# Patient Record
Sex: Male | Born: 1970 | Race: White | Hispanic: No | Marital: Single | Smoking: Current every day smoker
Health system: Southern US, Community
[De-identification: ages and names within clinical notes are randomized; demographics above are authoritative.]

## PROBLEM LIST (undated history)

## (undated) DIAGNOSIS — E785 Hyperlipidemia, unspecified: Secondary | ICD-10-CM

## (undated) DIAGNOSIS — F329 Major depressive disorder, single episode, unspecified: Secondary | ICD-10-CM

## (undated) DIAGNOSIS — F419 Anxiety disorder, unspecified: Secondary | ICD-10-CM

## (undated) DIAGNOSIS — A630 Anogenital (venereal) warts: Secondary | ICD-10-CM

## (undated) DIAGNOSIS — Z22322 Carrier or suspected carrier of Methicillin resistant Staphylococcus aureus: Secondary | ICD-10-CM

## (undated) DIAGNOSIS — G4700 Insomnia, unspecified: Secondary | ICD-10-CM

## (undated) DIAGNOSIS — N189 Chronic kidney disease, unspecified: Secondary | ICD-10-CM

## (undated) DIAGNOSIS — M549 Dorsalgia, unspecified: Secondary | ICD-10-CM

## (undated) DIAGNOSIS — M542 Cervicalgia: Secondary | ICD-10-CM

## (undated) DIAGNOSIS — F32A Depression, unspecified: Secondary | ICD-10-CM

## (undated) DIAGNOSIS — M1990 Unspecified osteoarthritis, unspecified site: Secondary | ICD-10-CM

## (undated) DIAGNOSIS — G8929 Other chronic pain: Secondary | ICD-10-CM

## (undated) HISTORY — DX: Anogenital (venereal) warts: A63.0

## (undated) HISTORY — DX: Other chronic pain: G89.29

## (undated) HISTORY — DX: Unspecified osteoarthritis, unspecified site: M19.90

## (undated) HISTORY — DX: Anxiety disorder, unspecified: F41.9

## (undated) HISTORY — DX: Dorsalgia, unspecified: M54.9

## (undated) HISTORY — DX: Chronic kidney disease, unspecified: N18.9

## (undated) HISTORY — DX: Depression, unspecified: F32.A

## (undated) HISTORY — DX: Major depressive disorder, single episode, unspecified: F32.9

## (undated) HISTORY — DX: Insomnia, unspecified: G47.00

## (undated) HISTORY — PX: BACK SURGERY: SHX140

## (undated) HISTORY — PX: NECK SURGERY: SHX720

## (undated) HISTORY — DX: Cervicalgia: M54.2

## (undated) HISTORY — DX: Hyperlipidemia, unspecified: E78.5

## (undated) HISTORY — DX: Carrier or suspected carrier of methicillin resistant Staphylococcus aureus: Z22.322

---

## 2006-07-14 ENCOUNTER — Emergency Department: Payer: Self-pay | Admitting: Emergency Medicine

## 2006-10-10 ENCOUNTER — Ambulatory Visit (HOSPITAL_COMMUNITY): Admission: RE | Admit: 2006-10-10 | Discharge: 2006-10-11 | Payer: Self-pay | Admitting: Neurological Surgery

## 2006-10-29 ENCOUNTER — Encounter: Admission: RE | Admit: 2006-10-29 | Discharge: 2006-10-29 | Payer: Self-pay | Admitting: Neurological Surgery

## 2007-04-07 ENCOUNTER — Encounter: Admission: RE | Admit: 2007-04-07 | Discharge: 2007-04-07 | Payer: Self-pay | Admitting: Neurological Surgery

## 2007-07-21 ENCOUNTER — Encounter: Admission: RE | Admit: 2007-07-21 | Discharge: 2007-07-21 | Payer: Self-pay | Admitting: Neurological Surgery

## 2007-10-20 ENCOUNTER — Encounter: Admission: RE | Admit: 2007-10-20 | Discharge: 2007-10-20 | Payer: Self-pay | Admitting: Neurological Surgery

## 2008-02-01 ENCOUNTER — Inpatient Hospital Stay (HOSPITAL_COMMUNITY): Admission: EM | Admit: 2008-02-01 | Discharge: 2008-02-09 | Payer: Self-pay | Admitting: Emergency Medicine

## 2008-02-06 ENCOUNTER — Ambulatory Visit: Payer: Self-pay | Admitting: Physical Medicine & Rehabilitation

## 2008-03-01 ENCOUNTER — Encounter: Admission: RE | Admit: 2008-03-01 | Discharge: 2008-03-01 | Payer: Self-pay | Admitting: Neurological Surgery

## 2008-04-12 ENCOUNTER — Encounter: Admission: RE | Admit: 2008-04-12 | Discharge: 2008-04-12 | Payer: Self-pay | Admitting: Neurological Surgery

## 2008-06-24 ENCOUNTER — Ambulatory Visit (HOSPITAL_COMMUNITY): Admission: RE | Admit: 2008-06-24 | Discharge: 2008-06-24 | Payer: Self-pay | Admitting: Neurological Surgery

## 2008-08-09 ENCOUNTER — Encounter: Admission: RE | Admit: 2008-08-09 | Discharge: 2008-08-09 | Payer: Self-pay | Admitting: Neurological Surgery

## 2008-10-11 ENCOUNTER — Encounter: Admission: RE | Admit: 2008-10-11 | Discharge: 2008-10-11 | Payer: Self-pay | Admitting: Neurological Surgery

## 2009-01-25 ENCOUNTER — Encounter: Admission: RE | Admit: 2009-01-25 | Discharge: 2009-01-25 | Payer: Self-pay | Admitting: Neurological Surgery

## 2009-04-25 ENCOUNTER — Encounter: Admission: RE | Admit: 2009-04-25 | Discharge: 2009-04-25 | Payer: Self-pay | Admitting: Neurological Surgery

## 2009-07-25 ENCOUNTER — Encounter: Admission: RE | Admit: 2009-07-25 | Discharge: 2009-07-25 | Payer: Self-pay | Admitting: Neurological Surgery

## 2010-07-17 ENCOUNTER — Emergency Department: Payer: Self-pay | Admitting: Emergency Medicine

## 2010-11-29 ENCOUNTER — Ambulatory Visit: Payer: Self-pay | Admitting: Pain Medicine

## 2011-01-01 ENCOUNTER — Ambulatory Visit: Payer: Self-pay | Admitting: Pain Medicine

## 2011-03-19 ENCOUNTER — Other Ambulatory Visit: Payer: Self-pay | Admitting: Neurological Surgery

## 2011-03-19 DIAGNOSIS — M545 Low back pain: Secondary | ICD-10-CM

## 2011-04-13 ENCOUNTER — Ambulatory Visit
Admission: RE | Admit: 2011-04-13 | Discharge: 2011-04-13 | Disposition: A | Discharge status: Home / Self Care | Payer: Medicare Other | Source: Ambulatory Visit | Attending: Neurological Surgery | Admitting: Neurological Surgery

## 2011-04-13 DIAGNOSIS — M545 Low back pain: Secondary | ICD-10-CM

## 2011-04-23 ENCOUNTER — Encounter (HOSPITAL_COMMUNITY)
Admission: RE | Admit: 2011-04-23 | Discharge: 2011-04-23 | Disposition: A | Discharge status: Home / Self Care | Payer: Medicare Other | Source: Ambulatory Visit | Attending: Neurological Surgery | Admitting: Neurological Surgery

## 2011-04-23 ENCOUNTER — Other Ambulatory Visit (HOSPITAL_COMMUNITY): Payer: Self-pay | Admitting: Neurological Surgery

## 2011-04-23 ENCOUNTER — Ambulatory Visit (HOSPITAL_COMMUNITY)
Admission: RE | Admit: 2011-04-23 | Discharge: 2011-04-23 | Disposition: A | Discharge status: Home / Self Care | Payer: Medicare Other | Source: Ambulatory Visit | Attending: Neurological Surgery | Admitting: Neurological Surgery

## 2011-04-23 DIAGNOSIS — M542 Cervicalgia: Secondary | ICD-10-CM | POA: Insufficient documentation

## 2011-04-23 DIAGNOSIS — Z01812 Encounter for preprocedural laboratory examination: Secondary | ICD-10-CM | POA: Insufficient documentation

## 2011-04-23 DIAGNOSIS — Z01811 Encounter for preprocedural respiratory examination: Secondary | ICD-10-CM | POA: Insufficient documentation

## 2011-04-23 DIAGNOSIS — Z01818 Encounter for other preprocedural examination: Secondary | ICD-10-CM | POA: Insufficient documentation

## 2011-04-23 LAB — CBC
MCH: 30.9 pg (ref 26.0–34.0)
MCHC: 34.3 g/dL (ref 30.0–36.0)
Platelets: 243 10*3/uL (ref 150–400)
RDW: 12.6 % (ref 11.5–15.5)

## 2011-04-23 LAB — DIFFERENTIAL
Basophils Relative: 0 % (ref 0–1)
Eosinophils Absolute: 0.1 10*3/uL (ref 0.0–0.7)
Eosinophils Relative: 1 % (ref 0–5)
Monocytes Absolute: 1 10*3/uL (ref 0.1–1.0)
Monocytes Relative: 8 % (ref 3–12)

## 2011-04-23 LAB — BASIC METABOLIC PANEL
CO2: 29 mEq/L (ref 19–32)
Calcium: 9.8 mg/dL (ref 8.4–10.5)
Creatinine, Ser: 0.87 mg/dL (ref 0.4–1.5)
Glucose, Bld: 84 mg/dL (ref 70–99)
Sodium: 136 mEq/L (ref 135–145)

## 2011-04-23 LAB — PROTIME-INR: Prothrombin Time: 12.5 seconds (ref 11.6–15.2)

## 2011-04-25 ENCOUNTER — Inpatient Hospital Stay (HOSPITAL_COMMUNITY)
Admission: RE | Admit: 2011-04-25 | Discharge: 2011-04-26 | DRG: 460 | Disposition: A | Discharge status: Home / Self Care | Payer: Medicare Other | Source: Ambulatory Visit | Attending: Neurological Surgery | Admitting: Neurological Surgery

## 2011-04-25 DIAGNOSIS — F17200 Nicotine dependence, unspecified, uncomplicated: Secondary | ICD-10-CM | POA: Diagnosis present

## 2011-04-25 DIAGNOSIS — Y834 Other reconstructive surgery as the cause of abnormal reaction of the patient, or of later complication, without mention of misadventure at the time of the procedure: Secondary | ICD-10-CM | POA: Diagnosis present

## 2011-04-25 DIAGNOSIS — Z01812 Encounter for preprocedural laboratory examination: Secondary | ICD-10-CM

## 2011-04-25 DIAGNOSIS — Z472 Encounter for removal of internal fixation device: Secondary | ICD-10-CM

## 2011-04-25 DIAGNOSIS — T8489XA Other specified complication of internal orthopedic prosthetic devices, implants and grafts, initial encounter: Secondary | ICD-10-CM | POA: Diagnosis present

## 2011-04-25 DIAGNOSIS — T84498A Other mechanical complication of other internal orthopedic devices, implants and grafts, initial encounter: Principal | ICD-10-CM | POA: Diagnosis present

## 2011-04-25 DIAGNOSIS — M549 Dorsalgia, unspecified: Secondary | ICD-10-CM | POA: Diagnosis present

## 2011-04-25 DIAGNOSIS — Z01810 Encounter for preprocedural cardiovascular examination: Secondary | ICD-10-CM

## 2011-04-25 DIAGNOSIS — G8929 Other chronic pain: Secondary | ICD-10-CM | POA: Diagnosis present

## 2011-04-25 DIAGNOSIS — Y92009 Unspecified place in unspecified non-institutional (private) residence as the place of occurrence of the external cause: Secondary | ICD-10-CM

## 2011-04-30 NOTE — Op Note (Signed)
NAME:  Mark Snow, Mark Snow NO.:  [id number]  MEDICAL RECORD NO.:  [id number]           PATIENT TYPE:  I  LOCATION:  3526                         FACILITY:  [facility]  PHYSICIAN:  Tia Alert, MD     DATE OF BIRTH:  August 02, 1971  DATE OF PROCEDURE:  04/25/2011 DATE OF DISCHARGE:                              OPERATIVE REPORT   PREOPERATIVE DIAGNOSES: 1. Painful hardware, L2-L4. 2. 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  POSTOPERATIVE DIAGNOSES: 1. Painful hardware, L2-L4. 2. 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  PROCEDURES: 1. Lumbar re-exploration with exploration of fusion, L2-L4. 2. Removal of segmental fixation, L2-L4. 3. Intertransverse arthrodesis, L2-L4 utilizing the Osteocel Plus and     Actifuse putty.  SURGEON:  Tia Alert, MD  ASSISTANT:  Donalee Citrin, MD  ANESTHESIA:  General endotracheal.  COMPLICATIONS:  None apparent.  INDICATIONS FOR PROCEDURE:  Mark Snow is a 40 year old gentleman who 3- 1/2 years ago underwent a L2-L4 open reduction and internal fixation of an L3 fracture.  He presented with chronic back pain.  He had a CT scan, which showed lucency around the L4 screws suggestive of a pseudoarthrosis at L3-4, it look it has a more solid fusion at L2-3.  I did flexion/extension views that showed no pathologic motion in any of the levels.  I recommended a lumbar re-exploration with removal of lumbar hardware.  I felt based on a CT scan that he had enough residual facet on each side after his open reduction and internal fixation to allow stability at L3-4.  I felt that he had a healed fracture of L3 and therefore and given his lack of pathologic motion on flexion/extension films, I felt this was probably a stable situation and it was probably safe to remove the hardware without redoing a fusion from L3 extended hardware down to L5.  This was all discussed with the patient in detail, he demonstrated understanding and wished to proceed.  FINDINGS AT OPERATION:  The  fusion was explored as stated above.  After removal of the rod, I pulled on each pedicle screw.  When pulling on the L2 and L3 pedicle screws, L2-3 moved as a unit suggesting fusion of L2- L3.  There was movement between L3 and L4 and the screws were obviously loose in L4.  The decision was made to expose the transverse processes on each side and redo the fusion from L2-L4 in hopes of achieving even further stability of the segments; however, it was not felt that this was an overly unstable situation and therefore I did not feel that I needed to re-instrument him down to L5 to add stability to the segments, this was a clinical judgment made intraoperatively.  DESCRIPTION OF PROCEDURE:  The patient was taken to the operating room. After induction of adequate generalized endotracheal anesthesia, he was rolled in prone position on chest rolls and all pressure points were padded.  His lumbar region was prepped with DuraPrep and draped in usual sterile fashion.  10 mL of local anesthesia was injected.  His old incision was opened and I carried this down through the fascia, identified the spinous processes, and then took down the musculature  in a subperiosteal fashion.  I carried this out laterally to identify the pedicle screws at L2, L3, and L4.  The tubal heads were identified and the tissues were resected from these and exposed the rods.  I then was able to remove the locking caps and then removed to rods in the cross- link.  I then pulled on each pedicle screw, the L2 and L3 segment seemed to be fused as all four screws moved in unison; however, there was movement at L3-4 between L3 and the L4 pedicle screws, suggesting a nonunion of the segment.  It did not appear to be overly unstable by this, I went back and reviewed his films.  I reviewed his CT scan and his flexion/extension views once again.  I still felt clinically based on what we are finding in the operation and based on his  films, we did need to re-instrument him, but it did make me want to expose his transverse processes and lie a bony mixture in the intertransverse space to try to achieve a redo intertransverse arthrodesis.  I was able to remove each pedicle screw, I then took my dissection out laterally, exposed the transverse processes.  I then decorticated each of these with a high-speed drill and placed a mixture of Osteocel Plus and Actifuse putty into the intertransverse space to perform arthrodesis at L2-L4 bilaterally.  I then placed a medium Hemovac drain and then dried all bleeding points and then closed the muscle and fascia with 0-Vicryl closing the subcutaneous and subcuticular tissues with 2-0 and 3-0 Vicryl and closed the skin with Benzoin and Steri-Strips.  The drapes were removed.  Sterile dressing was applied.  The patient was awakened from anesthesia and transferred to the recovery room in a stable condition.  At the end of the procedure, all sponge, needle, and instrument sponge counts were correct.     Tia Alert, MD     DSJ/MEDQ  D:  04/25/2011  T:  04/26/2011  Job:  [id number]  Electronically Signed by Marikay Alar MD on 04/30/2011 08:33:45 AM

## 2011-05-08 NOTE — Op Note (Signed)
NAME:  Mark Snow, Mark Snow NO.:  [id number]   MEDICAL RECORD NO.:  [id number]          PATIENT TYPE:  INP   LOCATION:  1831                         FACILITY:  [facility]   PHYSICIAN:  Tia Alert, MD     DATE OF BIRTH:  August 14, 1971   DATE OF PROCEDURE:  02/01/2008  DATE OF DISCHARGE:                               OPERATIVE REPORT   PREOPERATIVE DIAGNOSES:  Three column, L3 fracture with severe canal  compromise and numbness in the right leg.   POSTOPERATIVE DIAGNOSES:  Three column, L3 fracture with severe canal  compromise and numbness in the right leg with traumatic dural tear L3-4  and on the L3 nerve root.   PROCEDURE:  1. Open reduction internal fixation of L3 fracture.  2. Decompressive laminectomy L2-3, L3-4 for central canal and lateral      recess decompression with reduction of retropulsed fragments.  3. Repair of traumatic dural laceration, L3-4 with a running 6-0      Prolene suture and repair of a small dural tear over the L3 nerve      root also traumatic and not unintended.  4. Intertransverse arthrodesis, L2-L4 inclusive utilizing locally      harvested morselized autologous bone graft mixed with Actifuse      putty.  5. Segmental fixation L2-L4, utilizing the Legacy pedicle screw      system.   SURGEON:  Tia Alert, MD.   ASSISTANT:  None.   ANESTHESIA:  General endotracheal.   COMPLICATIONS:  None apparent.   INDICATIONS FOR PROCEDURE:  Mark Snow is a 40 year old gentleman who  is known to me who is status post a two-level ACDF with plating who fell  off a horse earlier this evening, suffering an L3 fracture.  This was a  three column fracture and it was felt to be unstable.  There was  significant retropulsion of the fragments of L3 causing severe canal  stenosis, especially at the pedicle level of L3.  He had numbness in his  right leg.  I recommended urgent decompression and instrumented fusion.  He understood the risks, benefits,  and suspected outcome and wished to  proceed.  I discussed with the family the possibility of a dural tear  secondary to the canal stenosis and the fracture fragments and indeed  this was what we found at the time of surgery was a long dural tear  extending from the pedicle level of L3 to the inferior disk level of L3-  4 and also a small dural tear over the nerve root itself of L3 and the  foramen, but also leaking spinal fluid.  Several nerve roots had  herniated through the long dural tear into the epidural space.  The  patient's family did understand the risks, benefits and suspected  outcome of surgery and they wished to proceed.   DESCRIPTION OF PROCEDURE:  The patient was taken to operating room and  after induction of adequate generalized endotracheal anesthesia he was  rolled in a prone position on chest rolls and all pressure points were  padded.  His lumbar region was prepped  with a DuraPrep after being  washed with Betadine and draped in the usual sterile fashion.  The 10 mL  of local anesthesia was injected and a dorsal midline incision was made  and carried down to the fascia.  The fascia was opened and the  paraspinous musculature was taken down in a subperiosteal fashion to  expose L2-3, L3-4 and L4-5.  Intraoperative fluoroscopy confirmed my  level and then I took the dissection out over the facets to expose the  transverse processes of L2, L3 and L4.  I planned on doing segmental  fixation with the pedicle screws in the pedicles of the fracture  fragment instead of taking this two levels above and two levels below  mostly because he is young and healthy with good bone quality and I have  had significant success with this in the past in my own practice and  during training.  Therefore this was the plan as long I could get good  screws into the L3 pedicle.  If I could not then I was going to take it  up to L1 and down to L5.  I did my exposure with this plan in mind.  I   then took the Leksell rongeur and removed the spinous process of L3 and  the bottom of L2.  I performed a complete laminectomy at L3 and the  bottom of L2.  I did come across significant nerve roots in the epidural  space.  Therefore, I packed this with a cottonoid and worked laterally  superiorly and inferiorly along all the normal tissue that I could find  decompressing widely in order to come in on this as best I could from  normal tissue to where the dural laceration was.  I found a very long  dural laceration that was traumatic in nature and not created during  surgery as an unintended durotomy.  I also found a small one over the L3  nerve root.  These were repaired with running 6-0 Nurolon sutures with  no leaks of CSF afterwards.  I then completed my decompression following  the L3 and the L4 nerve roots out into their foramina to decompress  them.  I achieved a wide foraminotomy at L3-4.  I used a Sypert bone  tamp to reach up under the L3 nerve root both superiorly and inferiorly  in the shoulder and the axilla the nerve root knock down fracture  fragments from the retropulsed bone.  I then palpated the coronary  dilator and felt like I had a nice decompression.  I then turned my  attention to the segmental fixation.  I localized the pedicle screw  entry zones using surface landmarks and lateral fluoroscopy.  I probed  each pedicle with a pedicle probe, tapped each pedicle with a 5/5 tap  and placed six 5 x 45 mm pedicle screws at the L2, L3 and L4 pedicles  bilaterally.  I then checked these with AP and lateral fluoroscopy.  I  felt like I had good purchase at the L3 and L4 pedicles.  I could see  the medial and superior parts of the pedicles.  I palpated and  visualized to make sure I did not have too medial trajectory with my  pedicle screws.  I then decorticated the transverse processes of L2, L3  and L4 and placed a mixture of local autograft and Actifuse out over  these to  perform intertransverse arthrodesis of L2-L4.  I  then placed  lordotic  rods into the multiaxial screw heads and locked these into  position with locking caps and anti-torque device and placed a separate  cross-link.  I irrigated with saline solution containing bacitracin,  spent considerable time drying up with bipolar cautery and with  Surgifoam and with Gelfoam and then lined the dura with Gelfoam and  Tisseel fibrin glue and then placed a medium Hemovac drain through a  separate stab incision and then closed the muscle and fascia with zero  Vicryl, closed the subcutaneous and subcuticular tissue with 2-0 and 3-0  Vicryl, and closed the skin with Benzoin and Steri-Strips.  The drapes  were removed.  A sterile dressing was applied.  The patient was awakened  from general anesthesia and transported to the recovery room in stable  condition.  At the end of procedure all sponge, needle and instrument  counts were correct.      Tia Alert, MD  Electronically Signed     DSJ/MEDQ  D:  02/02/2008  T:  02/02/2008  Job:  [id number]

## 2011-05-08 NOTE — Discharge Summary (Signed)
NAMEMarland Kitchen  Mark Snow, Mark Snow NO.:  [id number]   MEDICAL RECORD NO.:  [id number]          PATIENT TYPE:  INP   LOCATION:  3041                         FACILITY:  [facility]   PHYSICIAN:  Cherylynn Ridges, M.D.    DATE OF BIRTH:  1971/02/09   DATE OF ADMISSION:  02/01/2008  DATE OF DISCHARGE:  02/09/2008                               DISCHARGE SUMMARY   DISCHARGE DIAGNOSES:  1. Fall from horse.  2. L3 burst fracture.  3. Alcohol use.  4. Tobacco use.   CONSULTANTS:  Tia Alert, MD, for neurosurgery.   PROCEDURES:  L2 through L4 fusion.   HISTORY OF PRESENT ILLNESS:  This is a 40 year old white male who was  sitting on a horse having his picture taken when it bucked, throwing him  to the ground.  He had immediate pain and came in as a non-trauma code.  X-rays demonstrated an L3 burst fracture of significant severity.  Dr.  Yetta Barre was consulted and felt it needed stabilization.  He was taken to  the operating room immediately for a fusion and then transferred to the  floor for further care.   HOSPITAL COURSE:  The patient's hospital course was complicated mainly  by pain.  It took some time to get him under control but once we did, he  was able to mobilize with physical therapy until he got to the point  where he could go home with his mother in good condition.   DISCHARGE MEDICATIONS:  1. OxyContin 20 mg tablets take one p.o. b.i.d., #30 with no refill.  2. Percocet 10/325 mg take one to two p.o. q.4 h. p.r.n. pain, #60      with no refill.  3. Robaxin 500 mg tablets take one to two p.o. q.6 h. p.r.n. spasm,      #90 with no refill.  4. Valium 5 mg tablets take one p.o. q.6h. p.r.n. spasm, #60 with no      refill.  5. Lyrica 75 mg tablets take one p.o. b.i.d., #60 with no refill.   FOLLOW-UP:  The patient will follow up Dr. Yetta Barre as directed and will  call for ab appointment.  Follow-up with the trauma service will be on  an as-needed basis.      Earney Hamburg, P.A.      Cherylynn Ridges, M.D.  Electronically Signed    MJ/MEDQ  D:  02/09/2008  T:  02/10/2008  Job:  [id number]   cc:   Tia Alert, MD

## 2011-05-08 NOTE — Op Note (Signed)
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   PROCEDURE:  Posterior cervical exploration with posterior cervical  fusion C5-C6 utilizing Actifuse putty with nonsegmental instrumentation,  C5-C6 utilizing the vertex lateral mass fixation system.   SURGEON:  Tia Alert, MD   ANESTHESIA:  General endotracheal.   COMPLICATIONS:  None apparent.   INDICATIONS FOR PROCEDURE:  Mark Snow is a 40 year old gentleman who  had undergone a previous 2-level ACDF with plating a couple of years  ago.  He had continued neck pain.  He had a CT scan which showed  pseudoarthrosis of C5-C6.  This was felt to be a symptomatic  pseudoarthrosis.  We recommended a posterior cervical stabilization and  fusion.  He understood the risks, benefits, expected outcome, and wished  to proceed.   DESCRIPTION OF PROCEDURE:  The patient was taken to operating room.  After induction of adequate generalized endotracheal anesthesia, he was  rolled into the prone position on chest rolls and all pressure points  were padded.  His head was fixed in a 3-point [place] head rest.  His  posterior cervical region was prepped with DuraPrep and then draped in  usual sterile fashion.  Local anesthesia 5 mL was injected and a dorsal  midline incision was made and carried down to this cervical fascia.  It  was opened and the  paraspinous musculature was taken down in a  subperiosteal fashion to expose C5-C6.  Intraoperative fluoroscopy  confirmed my level and then I took the dissection out over the lateral  masses of C5-C6.  I then located the midpoints of the lateral masses and  also drilled the facet joints in order to decorticate these.  I located  the midpoints of lateral masses and decorticated 1 mm medial to the  midpoint of each lateral mass.  I then drilled in the upward and outward  direction utilizing the hand drill and the drill guide using a 14 mm  drill.  I drilled into the safe zone in an upward and outward direction.  I then used 14 mm lateral mass screws and placed these in an upward and  outward direction at C5-C6 bilaterally.  I then decorticated the lamina  and placed Actifuse putty over these bilaterally to perform a posterior  arthrodesis and then placed rods into the multiaxial screw heads of the  vertex screws and locked these into position with locking caps and an  anti-torque device.  I then irrigated with saline solution containing  bacitracin, dried all bleeding points, and then  closed the fascia with 0  Vicryl, closed the subcutaneous tissue and subcuticular tissue with 2-0  and 3-0 Vicryl,  closed the skin with Benzoin and Steri-Strips.  The drapes were removed.  Sterile dressing was applied.  The patient was awakened from general  anesthesia and transferred to recovery room in stable condition.  At the  end of procedure, all sponge, needle, and instrument counts were  correct.      Tia Alert, MD  Electronically Signed     DSJ/MEDQ  D:  06/24/2008  T:  06/24/2008  Job:  [id number]

## 2011-05-08 NOTE — Consult Note (Signed)
NAME:  Mark Snow, Mark Snow NO.:  [id number]   MEDICAL RECORD NO.:  [id number]          PATIENT TYPE:  INP   LOCATION:  1831                         FACILITY:  [facility]   PHYSICIAN:  Tia Alert, MD     DATE OF BIRTH:  08-24-1971   DATE OF CONSULTATION:  02/01/2008  DATE OF DISCHARGE:                                 CONSULTATION   CHIEF COMPLAINT:  L3 fracture.   HISTORY OF PRESENT ILLNESS:  Mark Snow is a 40 year old white male  well-known to me status post ACDF with plating at two levels who was  thrown from a horse earlier this evening.  He noted the onset of  immediate low back pain, states his legs drew up.  He did not try to  ambulate. He was taken by EMS to [facility] emergency department where  imaging showed a L3 compression fracture with significant canal  stenosis.  This is a 3-column injury. Neurosurgical evaluation was  requested.  The patient complains of numbness in the entire right leg  from the upper thigh down to the foot.  He denies any significant  weakness, though he feels like he cannot lift his right leg off the bed,  though he can his left leg.  It is difficult to know if this is pain  related or true weakness.  He does not have a catheter in and denies any  perineal numbness, though he has not urinated since the accident. He  admits to drinking 4-5 beers,  He complains of very severe back pain  which appears to be unrelenting.   MEDICAL HISTORY:  Two-level ACDF with plating   MEDICATIONS:  Occasional Tylenol or BC.   ALLERGIES:  No known drug allergies.   SOCIAL HISTORY:  Uses tobacco and alcohol products.   PHYSICAL EXAMINATION:  VITAL SIGNS:  Blood pressure 113/62, pulse 98,  respirations 20.  GENERAL:  A young white male lying in a stretcher in severe pain,  appears to be in agony.  HEENT:  Normocephalic, atraumatic.  Extraocular movements are intact.  NECK:  Nontender.  HEART:  Regular rate and rhythm.  EXTREMITIES:  No obvious  deformities.  NEUROLOGIC:  He is awake and alert.  He is interactive.  No aphasia.  His attention span is somewhat blunted by his pain. Fund of knowledge  and memory appear to be appropriate.  No facial asymmetry.  Tongue  protrudes in midline.  He can shrug his shoulders. His strength is 5/5  with good muscle tone and good muscle bulk and normal sensation in the  upper extremities. The lower extremities show good strength in the  distal lower extremities with 5/5 dorsi and plantar flexion. If I lift  his legs off the bed for him, he can keep them straight, and I cannot  break him at the knees suggestive of good quadriceps strength. I cannot  really test his hip flexor strength accurately to this in-bed exam  mostly because of pain. Sensation is grossly decreased in the right  lower extremity. Gait is not tested.   IMAGING STUDIES:  CT scan of cervical  spine shows a two-level fusion  without significant fracture or other complication.   Head CT shows a small hyperdensity in the right frontal white matter,  possibly consistent with contusion. However, this is quite small and no  edema mass effect or shift.  I see no other intracranial abnormality.   Lumbar spine CT scan shows a significant compression fracture of L3 with  greater than 50% loss of height extending into the posterior elements.  This is a three-column injury, also fracture of the right transverse  process at L3-L4.  There was retropulsion of fragments causing severe  spinal stenosis at this level with no kyphosis at this time.   ASSESSMENT/PLAN:  This is a 40 year old gentleman with a severe L3  fracture.  This is a three-column fracture. It is unstable by  definition, and he has severe spinal stenosis secondary to retropulsed  fragments.  I have recommended urgent surgical decompression and  instrumented fusion.  If we can get good screws into L3, we will make  this a short segment construct from L2-L4. However, if the  screws do not  appear to be very solid in 3, we will go L1-L5,  two levels above and  two levels below. I have explained the surgery to him and his family in  detail as best they can understand. They understand the risk of surgery  including bleeding, infection, nerve injury, numbness, weakness, CSF  leak, hardware failure, pseudoarthrosis, possible need for further  surgery, possible need for blood transfusion, lack of relief of  symptoms, worsening of symptoms, DVT, death, and anesthesia risk, and  they agree to proceed.  Questions have been encouraged and answered.  We  will proceed with surgery on an urgent basis.      Tia Alert, MD  Electronically Signed     DSJ/MEDQ  D:  02/01/2008  T:  02/02/2008  Job:  [id number]

## 2011-05-11 NOTE — Op Note (Signed)
NAME:  Mark Snow, Mark Snow NO.:  [id number]   MEDICAL RECORD NO.:  [id number]          PATIENT TYPE:  AMB   LOCATION:  SDS                          FACILITY:  [facility]   PHYSICIAN:  Tia Alert, MD     DATE OF BIRTH:  April 01, 1971   DATE OF PROCEDURE:  10/10/2006  DATE OF DISCHARGE:                                 OPERATIVE REPORT   PREOPERATIVE DIAGNOSIS:  Cervical spondylosis with cervical stenosis, C5-6,  C6-7.   POSTOPERATIVE DIAGNOSIS:  Cervical spondylosis with cervical stenosis, C5-6,  C6-7.   PROCEDURES:  1. Decompressive anterior cervical diskectomy, C5-6, C6-7.  2. Anterior cervical arthrodesis, C5-6, C6-7 utilizing a 7-mm MTF      corticocancellous allograft at C5-6 and an 8 mm graft at C6-7.  3. Anterior cervical plating, C5 to C7 inclusive, utilizing a Venture      plate.   SURGEON:  Tia Alert, MD   ASSISTANT:  Donalee Citrin, MD   ANESTHESIA:  General endotracheal.   COMPLICATIONS:  None apparent.   INDICATIONS FOR PROCEDURE:  Mark Snow is a 40 year old white male who was  referred with neck pain with occasional electric shock-like sensations down  his arms into his fingers and down his back.  He had an MRI which showed  cervical spondylosis with stenosis at C5-6 and C6-7, and there was signal  change in the spinal cord at the C6-7 level.  I recommended a two-level  decompressive anterior cervical diskectomy with fusion and plating at C5-6  and C6-7.  He understood the risks, benefits and expected outcome and he  wished to proceed.   DESCRIPTION OF PROCEDURE:  The patient was taken to the operating room and  after the induction of adequate generalized endotracheal anesthesia, he was  placed in the supine position on the operating room table.  His right  anterior cervical region was prepped with DuraPrep and then draped in the  usual sterile fashion.  Local anesthesia 5 mL was injected and a transverse  incision was made to the right of  midline and carried down to the platysma,  which was elevated, opened and undermined with Metzenbaum scissors.  I then  dissected in a plane medial to the sternocleidomastoid muscle and internal  carotid artery and lateral to the trachea and esophagus to expose C5-6 and  C6-7.  Intraoperative fluoroscopy confirmed my level and then the longus  colli muscles were taken down bilaterally and the Shadow Line retractors  were placed under these to expose C5-6 and C6-7.  The annulus was incised  and the initial diskectomy was done with pituitary rongeurs and curved  curettes.  We then brought in the high-speed drill and drilled each disk  space in a rectangular fashion, widening the disk space to 7 mm at C5-6 and  8 mm of C6-7, where he had signal change in the spinal cord.  We drilled the  endplates to prepare for later arthrodesis.  We drilled down to the level of  the posterior longitudinal ligament, at which time we brought in the  operating microscope.  We opened the posterior  longitudinal ligament at C6-7  with a nerve hook and then removed it in a circumferential fashion by  undercutting the bodies of C6 and C7 with the 2 and 1 mm Kerrison punch.  Bilateral foraminotomies were performed.  We could palpate the pedicles,  palpate along the superior surface of the pedicles to assure adequate  decompression of the nerve roots.  The central canal was well-decompressed.  The dura was capacious and full all the way across.  Again, we had undercut  the body of C6 and C7 with a 2-mm Kerrison punch, utilizing a microscope to  look as far up and as far down as we could to assure adequate decompression.  We used a nerve hook and palpated to assure adequate decompression.  We then  went to C5-6 and performed the exact same decompression.  I opened the  posterior longitudinal ligament and performed a circumferential  decompression with the 2-mm Kerrison punch, undercutting the body of C5 and  C6 to  decompress the central canal.  Bilateral foraminotomies were  performed.  We palpated with a nerve hook to assure adequate decompression  of the nerve roots and the central canal.  Once our decompression complete,  we irrigated with saline solution containing bacitracin, dried our surgical  bed with Gelfoam, measured our interspaces and used a 7 mm MTF  corticocancellous allograft at C5-6 and an 8 mm graft at C6-7 and tapped  these into position, countersinking these by a millimeter.  We then used a  Venture plate, placed two 13 mm variable-angle screws into the bodies of C5,  C6 and C7, and these were locked into the plate by locking mechanism on the  plate.  We then spent considerable time drying up with bipolar cautery with  Gelfoam and finally with Surgifoam and with significant amounts of  irrigation and once meticulous hemostasis was achieved, closed the platysma  with 3-0 Vicryl, closed the subcuticular tissue with 3-0 Vicryl, and closed  the skin with Benzoin and Steri-Strips.  The drapes were removed.  A sterile  dressing was applied.  The patient was awakened from general anesthesia and  transported to the recovery room in stable condition.  At the end of the  procedure all sponge, needle and instrument counts were correct.      Tia Alert, MD  Electronically Signed     DSJ/MEDQ  D:  10/10/2006  T:  10/11/2006  Job:  [id number]

## 2011-05-22 ENCOUNTER — Other Ambulatory Visit: Payer: Self-pay | Admitting: Neurological Surgery

## 2011-05-22 ENCOUNTER — Ambulatory Visit
Admission: RE | Admit: 2011-05-22 | Discharge: 2011-05-22 | Disposition: A | Discharge status: Home / Self Care | Payer: Medicare Other | Source: Ambulatory Visit | Attending: Neurological Surgery | Admitting: Neurological Surgery

## 2011-05-22 DIAGNOSIS — M79609 Pain in unspecified limb: Secondary | ICD-10-CM

## 2011-05-22 DIAGNOSIS — M545 Low back pain: Secondary | ICD-10-CM

## 2011-05-22 DIAGNOSIS — Z981 Arthrodesis status: Secondary | ICD-10-CM

## 2011-09-14 LAB — CROSSMATCH: ABO/RH(D): O POS

## 2011-09-14 LAB — DIFFERENTIAL
Basophils Absolute: 0
Eosinophils Relative: 1
Lymphocytes Relative: 27
Lymphs Abs: 3.1
Monocytes Absolute: 0.7

## 2011-09-14 LAB — BASIC METABOLIC PANEL
CO2: 22
Calcium: 7.9 — ABNORMAL LOW
Creatinine, Ser: 0.89
Glucose, Bld: 135 — ABNORMAL HIGH

## 2011-09-14 LAB — ETHANOL: Alcohol, Ethyl (B): 151 — ABNORMAL HIGH

## 2011-09-14 LAB — I-STAT 8, (EC8 V) (CONVERTED LAB)
Acid-base deficit: 4 — ABNORMAL HIGH
Chloride: 103
HCT: 40
Hemoglobin: 13.6
Operator id: 281221
Potassium: 3.7

## 2011-09-14 LAB — POCT I-STAT CREATININE: Creatinine, Ser: 1.2

## 2011-09-14 LAB — CBC
HCT: 37 — ABNORMAL LOW
Hemoglobin: 10.9 — ABNORMAL LOW
Hemoglobin: 13
MCHC: 35.2
MCV: 94
RDW: 12.3
RDW: 12.5

## 2011-09-20 LAB — BASIC METABOLIC PANEL
Calcium: 9.3
Creatinine, Ser: 0.99
GFR calc Af Amer: >60
GFR calc non Af Amer: >60
Glucose, Bld: 102 — ABNORMAL HIGH
Sodium: 138

## 2011-09-20 LAB — DIFFERENTIAL
Basophils Relative: 0
Eosinophils Absolute: 0.2
Eosinophils Relative: 3
Lymphs Abs: 2
Monocytes Relative: 10

## 2011-09-20 LAB — CBC
HCT: 42.3
MCHC: 34.8
MCV: 92.6
Platelets: 197
WBC: 6.5

## 2011-09-24 ENCOUNTER — Emergency Department: Payer: Self-pay | Admitting: Emergency Medicine

## 2013-12-15 ENCOUNTER — Emergency Department: Payer: Self-pay | Admitting: Internal Medicine

## 2014-03-22 ENCOUNTER — Ambulatory Visit: Payer: Self-pay | Admitting: General Surgery

## 2014-03-22 ENCOUNTER — Encounter: Payer: Self-pay | Admitting: General Surgery

## 2014-03-22 ENCOUNTER — Other Ambulatory Visit: Payer: Self-pay | Admitting: General Surgery

## 2014-03-22 ENCOUNTER — Ambulatory Visit (INDEPENDENT_AMBULATORY_CARE_PROVIDER_SITE_OTHER): Payer: Medicare Other | Admitting: General Surgery

## 2014-03-22 VITALS — BP 122/80 | HR 84 | Temp 98.0°F | Resp 14 | Ht 67.0 in | Wt 183.0 lb

## 2014-03-22 DIAGNOSIS — L03319 Cellulitis of trunk, unspecified: Secondary | ICD-10-CM

## 2014-03-22 DIAGNOSIS — L03317 Cellulitis of buttock: Secondary | ICD-10-CM

## 2014-03-22 DIAGNOSIS — L0231 Cutaneous abscess of buttock: Secondary | ICD-10-CM

## 2014-03-22 DIAGNOSIS — L02219 Cutaneous abscess of trunk, unspecified: Secondary | ICD-10-CM | POA: Diagnosis not present

## 2014-03-22 HISTORY — PX: INCISION AND DRAINAGE ABSCESS: SHX5864

## 2014-03-22 LAB — CBC WITH DIFFERENTIAL/PLATELET
BASOS PCT: 0.4 %
Basophil #: 0 10*3/uL (ref 0.0–0.1)
EOS ABS: 0.2 10*3/uL (ref 0.0–0.7)
EOS PCT: 3 %
HCT: 36 % — ABNORMAL LOW (ref 40.0–52.0)
HGB: 12.5 g/dL — ABNORMAL LOW (ref 13.0–18.0)
LYMPHS ABS: 1.8 10*3/uL (ref 1.0–3.6)
LYMPHS PCT: 24 %
MCH: 31.5 pg (ref 26.0–34.0)
MCHC: 34.5 g/dL (ref 32.0–36.0)
MCV: 91 fL (ref 80–100)
Monocyte #: 0.8 x10 3/mm (ref 0.2–1.0)
Monocyte %: 10.3 %
Neutrophil #: 4.8 10*3/uL (ref 1.4–6.5)
Neutrophil %: 62.3 %
PLATELETS: 239 10*3/uL (ref 150–440)
RBC: 3.96 10*6/uL — AB (ref 4.40–5.90)
RDW: 12.6 % (ref 11.5–14.5)
WBC: 7.6 10*3/uL (ref 3.8–10.6)

## 2014-03-22 LAB — BASIC METABOLIC PANEL
Anion Gap: 6 — ABNORMAL LOW (ref 7–16)
BUN: 11 mg/dL (ref 7–18)
CHLORIDE: 103 mmol/L (ref 98–107)
CO2: 25 mmol/L (ref 21–32)
Calcium, Total: 8.8 mg/dL (ref 8.5–10.1)
Creatinine: 1.01 mg/dL (ref 0.60–1.30)
EGFR (African American): >60
GLUCOSE: 84 mg/dL (ref 65–99)
OSMOLALITY: 267 (ref 275–301)
POTASSIUM: 3.9 mmol/L (ref 3.5–5.1)
SODIUM: 134 mmol/L — AB (ref 136–145)

## 2014-03-22 NOTE — Progress Notes (Signed)
Patient ID: Mark LeffKenneth S Snow, male   DOB: 1971-10-28, 43 y.o.   MRN: [id number]  Chief Complaint  Patient presents with  . Other    skin lesion    HPI [name] is a 43 y.o. male here today for a evaluation of a skin lesion. Patient states the area on his right buttock and left inguinal area . The area is red and swollen very painful he states. He states he thinks he  got bit by a insect about a week ago.  The right hip area is similar to a spider bite he experienced 10 years ago. The left perineal/groin abscess has been different with increased fluctuance and persistent drainage. The patient was placed on Septra on 03/19/2014 by his primary care physician.  He is not any difficulty with bowel or bladder function. No history of perianal pain.   HPI  Past Medical History  Diagnosis Date  . Back pain   . HLD (hyperlipidemia)     Past Surgical History  Procedure Laterality Date  . Back surgery  [id number]    Dr. Yetta BarreJones  . Neck surgery  2007,2010    Family History  Problem Relation Age of Onset  . Lung cancer Father     Social History History  Substance Use Topics  . Smoking status: Current Every Day Smoker -- 1.00 packs/day for 20 years    Types: Cigarettes  . Smokeless tobacco: Never Used  . Alcohol Use: No    No Known Allergies  Current Outpatient Prescriptions  Medication Sig Dispense Refill  . aspirin 81 MG tablet Take 81 mg by mouth daily.      Marland Kitchen. atorvastatin (LIPITOR) 20 MG tablet Take 20 mg by mouth daily.      . clonazePAM (KLONOPIN) 1 MG tablet Take 1 mg by mouth at bedtime.      . cyclobenzaprine (FLEXERIL) 10 MG tablet Take 10 mg by mouth 3 (three) times daily as needed for muscle spasms.      . OxyCODONE HCl ER 30 MG T12A Take 1 tablet by mouth 4 (four) times daily as needed.       No current facility-administered medications for this visit.    Review of Systems Review of Systems  Constitutional: Negative.   Respiratory: Negative.    Cardiovascular: Negative.     Blood pressure 122/80, pulse 84, temperature 98 F (36.7 C), resp. rate 14, height 5\' 7"  (1.702 m), weight 183 lb (83.008 kg).  Physical Exam Physical Exam  Constitutional: He is oriented to person, place, and time. He appears well-developed and well-nourished.  Eyes: Conjunctivae are normal.  Neck: Neck supple.  Cardiovascular: Normal rate, regular rhythm and normal heart sounds.   Pulmonary/Chest: Effort normal and breath sounds normal.  Genitourinary: Testes normal.     Neurological: He is alert and oriented to person, place, and time.  Skin: Skin is warm and dry.        Assessment    Left perineal/groin abscess. Right lateral hip abscess.    Plan    The patient is having far too much local tenderness or any procedure under local anesthesia. General anesthesia is required for adequate drainage of the left perineal/groin abscess.  The patient reported having had a "sip" of Sprite between 11- 12 this morning. The case was reviewed by phone with [name], M.D. Plans are for general anesthesia to be administered at 3 PM today.       Earline MayotteByrnett, [name] 03/22/2014, 12:35 PM

## 2014-03-23 ENCOUNTER — Encounter: Payer: Self-pay | Admitting: General Surgery

## 2014-03-24 ENCOUNTER — Telehealth: Payer: Self-pay | Admitting: *Deleted

## 2014-03-24 LAB — PATHOLOGY REPORT

## 2014-03-24 NOTE — Telephone Encounter (Signed)
[facility] lab called to let us know patient had a culture from groin and right hip, results MRSA, Dr. Lemar LivingsByrnett aware and patient is on appropriate antibiotic therapy.

## 2014-03-25 ENCOUNTER — Encounter: Payer: Self-pay | Admitting: General Surgery

## 2014-03-25 ENCOUNTER — Ambulatory Visit (INDEPENDENT_AMBULATORY_CARE_PROVIDER_SITE_OTHER): Payer: Medicare Other | Admitting: General Surgery

## 2014-03-25 VITALS — BP 114/78 | HR 84 | Resp 16 | Ht 67.0 in | Wt 184.0 lb

## 2014-03-25 DIAGNOSIS — L0231 Cutaneous abscess of buttock: Secondary | ICD-10-CM

## 2014-03-25 DIAGNOSIS — L03317 Cellulitis of buttock: Principal | ICD-10-CM

## 2014-03-25 MED ORDER — SULFAMETHOXAZOLE-TMP DS 800-160 MG PO TABS: 1.0000 | ORAL_TABLET | Freq: Two times a day (BID) | ORAL | Status: DC

## 2014-03-25 NOTE — Patient Instructions (Addendum)
Patient to return in one week. 

## 2014-03-25 NOTE — Progress Notes (Signed)
Patient ID: Mark Snow, male   DOB: [birth year], 43 y.o.   MRN: [id number]  Chief Complaint  Patient presents with  . Routine Post Op    I & D on buttock     HPI Mark LeffKenneth S Snow is a 43 y.o. male. here today for his post op  I & D on buttock and left groin abscess done on 03/22/14. Patient states he is still very sore, but significantly improved from before surgery.   HPI  Past Medical History  Diagnosis Date  . Back pain   . HLD (hyperlipidemia)     Past Surgical History  Procedure Laterality Date  . Back surgery  [id number]    Dr. Yetta BarreJones  . Neck surgery  2007,2010  . Incision and drainage abscess  03/22/14    buttock    Family History  Problem Relation Age of Onset  . Lung cancer Father     Social History History  Substance Use Topics  . Smoking status: Current Every Day Smoker -- 1.00 packs/day for 20 years    Types: Cigarettes  . Smokeless tobacco: Never Used  . Alcohol Use: No    No Known Allergies  Current Outpatient Prescriptions  Medication Sig Dispense Refill  . aspirin 81 MG tablet Take 81 mg by mouth daily.      Marland Kitchen. atorvastatin (LIPITOR) 20 MG tablet Take 20 mg by mouth daily.      . clonazePAM (KLONOPIN) 1 MG tablet Take 1 mg by mouth at bedtime.      . cyclobenzaprine (FLEXERIL) 10 MG tablet Take 10 mg by mouth 3 (three) times daily as needed for muscle spasms.      Marland Kitchen. HYDROcodone-acetaminophen (NORCO/VICODIN) 5-325 MG per tablet Take 1 tablet by mouth 3 (three) times daily as needed.      . OxyCODONE HCl ER 30 MG T12A Take 1 tablet by mouth 4 (four) times daily as needed.      . sulfamethoxazole-trimethoprim (BACTRIM DS) 800-160 MG per tablet Take 1 tablet by mouth daily at 6 (six) AM.      . sulfamethoxazole-trimethoprim (BACTRIM DS) 800-160 MG per tablet Take 1 tablet by mouth 2 (two) times daily.  30 tablet  0   No current facility-administered medications for this visit.    Review of Systems Review of Systems  Constitutional: Negative.    Respiratory: Negative.   Cardiovascular: Negative.     Blood pressure 114/78, pulse 84, resp. rate 16, height 5\' 7"  (1.702 m), weight 184 lb (83.462 kg).  Physical Exam Physical Exam  Constitutional: He is oriented to person, place, and time. He appears well-developed and well-nourished.  Eyes: Conjunctivae are normal.  Neck: Neck supple.  Genitourinary:     Thickening in the left groin, drain intact.  Neurological: He is alert and oriented to person, place, and time.  Skin: Skin is warm and dry.       Data Reviewed Culture both sites showed MRSA. Sensitive to Bactrim therapy presently being administered.  Assessment    Improvement in both the right gluteal and left groin/perineal infections.     Plan    The patient will have a renal on his Bactrim therapy for an additional 2 weeks. Reassessment in one week.    [id number] Mountainview DriveKanhka Linthavong     Earline MayotteByrnett, Momen Ham W 03/27/2014, 6:21 AM

## 2014-03-26 LAB — WOUND CULTURE

## 2014-03-29 ENCOUNTER — Encounter: Payer: Self-pay | Admitting: General Surgery

## 2014-03-29 ENCOUNTER — Ambulatory Visit (INDEPENDENT_AMBULATORY_CARE_PROVIDER_SITE_OTHER): Payer: Medicare Other | Admitting: General Surgery

## 2014-03-29 VITALS — BP 110/68 | HR 88 | Resp 16 | Ht 67.0 in | Wt 180.0 lb

## 2014-03-29 DIAGNOSIS — L0231 Cutaneous abscess of buttock: Secondary | ICD-10-CM

## 2014-03-29 DIAGNOSIS — L03317 Cellulitis of buttock: Principal | ICD-10-CM

## 2014-03-29 NOTE — Patient Instructions (Signed)
Patient to return in 1 week. The patient is aware to call back for any questions or concerns.

## 2014-03-29 NOTE — Progress Notes (Signed)
Patient ID: Mark Snow Gancarz, male   DOB: 1971/06/02, 43 y.o.   MRN: [id number]  Chief Complaint  Patient presents with  . Follow-up    abscess    HPI Mark Snow Milham is a 43 y.o. male here today for his post op I & D on buttock and left groin abscess done on 03/22/14. No new problems at this time. He is still having some drainage at this time.   HPI  Past Medical History  Diagnosis Date  . Back pain   . HLD (hyperlipidemia)     Past Surgical History  Procedure Laterality Date  . Back surgery  [id number]    Dr. Yetta BarreJones  . Neck surgery  2007,2010  . Incision and drainage abscess  03/22/14    buttock    Family History  Problem Relation Age of Onset  . Lung cancer Father     Social History History  Substance Use Topics  . Smoking status: Current Every Day Smoker -- 1.00 packs/day for 20 years    Types: Cigarettes  . Smokeless tobacco: Never Used  . Alcohol Use: No    No Known Allergies  Current Outpatient Prescriptions  Medication Sig Dispense Refill  . aspirin 81 MG tablet Take 81 mg by mouth daily.      Marland Kitchen. atorvastatin (LIPITOR) 20 MG tablet Take 20 mg by mouth daily.      . clonazePAM (KLONOPIN) 1 MG tablet Take 1 mg by mouth at bedtime.      . cyclobenzaprine (FLEXERIL) 10 MG tablet Take 10 mg by mouth 3 (three) times daily as needed for muscle spasms.      Marland Kitchen. HYDROcodone-acetaminophen (NORCO/VICODIN) 5-325 MG per tablet Take 1 tablet by mouth 3 (three) times daily as needed.      . OxyCODONE HCl ER 30 MG T12A Take 1 tablet by mouth 4 (four) times daily as needed.      . sulfamethoxazole-trimethoprim (BACTRIM DS) 800-160 MG per tablet Take 1 tablet by mouth daily at 6 (six) AM.      . sulfamethoxazole-trimethoprim (BACTRIM DS) 800-160 MG per tablet Take 1 tablet by mouth 2 (two) times daily.  30 tablet  0   No current facility-administered medications for this visit.    Review of Systems Review of Systems  Constitutional: Negative.   Respiratory: Negative.    Cardiovascular: Negative.   Gastrointestinal: Negative.     Blood pressure 110/68, pulse 88, resp. rate 16, height 5\' 7"  (1.702 m), weight 180 lb (81.647 kg).  Physical Exam Physical Exam Marked decrease in induration. Drains removed without incident from the left groin area. The right posterior hip wound is granulating nicely.  Data Reviewed Culture of both sides showed MRSA. Pathology showed necrotic tissue consistent with abscess.  Assessment    Resolving abscess of right posterior gluteal area and left groin.    Plan    Followup examination in one week. The patient will complete his present course of antibiotics.       Earline MayotteByrnett, Quinnley Colasurdo W 03/30/2014, 3:16 PM

## 2014-04-06 ENCOUNTER — Encounter: Payer: Self-pay | Admitting: General Surgery

## 2014-04-06 ENCOUNTER — Ambulatory Visit (INDEPENDENT_AMBULATORY_CARE_PROVIDER_SITE_OTHER): Payer: Medicare Other | Admitting: General Surgery

## 2014-04-06 VITALS — BP 120/78 | HR 76 | Resp 12 | Ht 67.0 in | Wt 189.0 lb

## 2014-04-06 DIAGNOSIS — L0231 Cutaneous abscess of buttock: Secondary | ICD-10-CM

## 2014-04-06 DIAGNOSIS — L03317 Cellulitis of buttock: Principal | ICD-10-CM

## 2014-04-06 NOTE — Patient Instructions (Signed)
Patient to return as needed. 

## 2014-04-06 NOTE — Progress Notes (Signed)
Patient ID: Mark Snow, male   DOB: 19-Nov-1971, 43 y.o.   MRN: [id number]  Chief Complaint  Patient presents with  . Follow-up    buttock abscess    HPI Mark Snow is a 43 y.o. male  here today for his post op I & D on buttock and left groin abscess done on 03/22/14. No new problems at this time. He is still having some drainage at this time  HPI  Past Medical History  Diagnosis Date  . Back pain   . HLD (hyperlipidemia)     Past Surgical History  Procedure Laterality Date  . Back surgery  [id number],2013    Dr. Yetta BarreJones  . Neck surgery  2007,2010  . Incision and drainage abscess  03/22/14    buttock    Family History  Problem Relation Age of Onset  . Lung cancer Father     Social History History  Substance Use Topics  . Smoking status: Current Every Day Smoker -- 1.00 packs/day for 20 years    Types: Cigarettes  . Smokeless tobacco: Never Used  . Alcohol Use: No    No Known Allergies  Current Outpatient Prescriptions  Medication Sig Dispense Refill  . aspirin 81 MG tablet Take 81 mg by mouth daily.      Marland Kitchen. atorvastatin (LIPITOR) 20 MG tablet Take 20 mg by mouth daily.      . clonazePAM (KLONOPIN) 1 MG tablet Take 1 mg by mouth at bedtime.      . cyclobenzaprine (FLEXERIL) 10 MG tablet Take 10 mg by mouth 3 (three) times daily as needed for muscle spasms.      Marland Kitchen. HYDROcodone-acetaminophen (NORCO/VICODIN) 5-325 MG per tablet Take 1 tablet by mouth 3 (three) times daily as needed.      . OxyCODONE HCl ER 30 MG T12A Take 1 tablet by mouth 4 (four) times daily as needed.      . sulfamethoxazole-trimethoprim (BACTRIM DS) 800-160 MG per tablet Take 1 tablet by mouth daily at 6 (six) AM.      . sulfamethoxazole-trimethoprim (BACTRIM DS) 800-160 MG per tablet Take 1 tablet by mouth 2 (two) times daily.  30 tablet  0   No current facility-administered medications for this visit.    Review of Systems Review of Systems  Constitutional: Negative.   Respiratory:  Negative.   Cardiovascular: Negative.     Blood pressure 120/78, pulse 76, resp. rate 12, height 5\' 7"  (1.702 m), weight 189 lb (85.73 kg).  Physical Exam Physical Exam 5 x 10 mm area of residual granulation tissue on the right posterior lateral hip. Scant surrounding erythema. No induration.  5 x 20 mm area of residual granulation in the left inguinal/groin area. Mild thickening superior to this adjacent to the lateral aspect of the scrotum, improved from last exam.  Data Reviewed Both sites showed MRSA.  Assessment    Doing well status post operative drainage of the left groin/perineal wound and the right posterior lateral hip    Plan    Residual thickening should resolve and time. No additional intervention is required. The patient was encouraged to call if he develops any new problems.       Merrily PewJeffrey W Tabor Denham 04/06/2014, 3:21 PM

## 2014-07-26 ENCOUNTER — Emergency Department: Payer: Self-pay | Admitting: Emergency Medicine

## 2015-01-13 ENCOUNTER — Ambulatory Visit: Payer: Self-pay | Admitting: Pain Medicine

## 2015-01-13 DIAGNOSIS — M542 Cervicalgia: Secondary | ICD-10-CM | POA: Diagnosis not present

## 2015-01-13 DIAGNOSIS — Z9889 Other specified postprocedural states: Secondary | ICD-10-CM | POA: Diagnosis not present

## 2015-01-13 DIAGNOSIS — M47812 Spondylosis without myelopathy or radiculopathy, cervical region: Secondary | ICD-10-CM | POA: Diagnosis not present

## 2015-01-13 DIAGNOSIS — G894 Chronic pain syndrome: Secondary | ICD-10-CM | POA: Diagnosis not present

## 2015-01-13 DIAGNOSIS — M47817 Spondylosis without myelopathy or radiculopathy, lumbosacral region: Secondary | ICD-10-CM | POA: Diagnosis not present

## 2015-01-13 DIAGNOSIS — F1120 Opioid dependence, uncomplicated: Secondary | ICD-10-CM | POA: Diagnosis not present

## 2015-01-13 DIAGNOSIS — Z79899 Other long term (current) drug therapy: Secondary | ICD-10-CM | POA: Diagnosis not present

## 2015-01-13 DIAGNOSIS — Z79891 Long term (current) use of opiate analgesic: Secondary | ICD-10-CM | POA: Diagnosis not present

## 2015-01-13 DIAGNOSIS — M79609 Pain in unspecified limb: Secondary | ICD-10-CM | POA: Diagnosis not present

## 2015-01-13 DIAGNOSIS — M5416 Radiculopathy, lumbar region: Secondary | ICD-10-CM | POA: Diagnosis not present

## 2015-01-21 DIAGNOSIS — G4701 Insomnia due to medical condition: Secondary | ICD-10-CM | POA: Diagnosis not present

## 2015-01-21 DIAGNOSIS — F419 Anxiety disorder, unspecified: Secondary | ICD-10-CM | POA: Diagnosis not present

## 2015-01-21 DIAGNOSIS — F329 Major depressive disorder, single episode, unspecified: Secondary | ICD-10-CM | POA: Diagnosis not present

## 2015-01-21 DIAGNOSIS — G8929 Other chronic pain: Secondary | ICD-10-CM | POA: Diagnosis not present

## 2015-01-21 DIAGNOSIS — M545 Low back pain: Secondary | ICD-10-CM | POA: Diagnosis not present

## 2015-01-24 DIAGNOSIS — R5383 Other fatigue: Secondary | ICD-10-CM | POA: Diagnosis not present

## 2015-01-24 DIAGNOSIS — M549 Dorsalgia, unspecified: Secondary | ICD-10-CM | POA: Diagnosis not present

## 2015-01-24 DIAGNOSIS — E785 Hyperlipidemia, unspecified: Secondary | ICD-10-CM | POA: Diagnosis not present

## 2015-01-24 DIAGNOSIS — F329 Major depressive disorder, single episode, unspecified: Secondary | ICD-10-CM | POA: Diagnosis not present

## 2015-01-24 DIAGNOSIS — F419 Anxiety disorder, unspecified: Secondary | ICD-10-CM | POA: Diagnosis not present

## 2015-01-26 ENCOUNTER — Ambulatory Visit: Payer: Self-pay | Admitting: Pain Medicine

## 2015-01-26 DIAGNOSIS — M5126 Other intervertebral disc displacement, lumbar region: Secondary | ICD-10-CM | POA: Diagnosis not present

## 2015-01-26 DIAGNOSIS — M4806 Spinal stenosis, lumbar region: Secondary | ICD-10-CM | POA: Diagnosis not present

## 2015-01-27 DIAGNOSIS — F419 Anxiety disorder, unspecified: Secondary | ICD-10-CM | POA: Diagnosis not present

## 2015-01-27 DIAGNOSIS — G8929 Other chronic pain: Secondary | ICD-10-CM | POA: Diagnosis not present

## 2015-01-27 DIAGNOSIS — M545 Low back pain: Secondary | ICD-10-CM | POA: Diagnosis not present

## 2015-01-27 DIAGNOSIS — E559 Vitamin D deficiency, unspecified: Secondary | ICD-10-CM | POA: Diagnosis not present

## 2015-01-27 DIAGNOSIS — E785 Hyperlipidemia, unspecified: Secondary | ICD-10-CM | POA: Diagnosis not present

## 2015-02-11 ENCOUNTER — Ambulatory Visit: Payer: Self-pay | Admitting: Pain Medicine

## 2015-02-11 DIAGNOSIS — M4806 Spinal stenosis, lumbar region: Secondary | ICD-10-CM | POA: Diagnosis not present

## 2015-02-11 DIAGNOSIS — M5126 Other intervertebral disc displacement, lumbar region: Secondary | ICD-10-CM | POA: Diagnosis not present

## 2015-02-11 DIAGNOSIS — M545 Low back pain: Secondary | ICD-10-CM | POA: Diagnosis not present

## 2015-02-11 DIAGNOSIS — M4856XA Collapsed vertebra, not elsewhere classified, lumbar region, initial encounter for fracture: Secondary | ICD-10-CM | POA: Diagnosis not present

## 2015-02-11 DIAGNOSIS — M542 Cervicalgia: Secondary | ICD-10-CM | POA: Diagnosis not present

## 2015-02-11 DIAGNOSIS — M47817 Spondylosis without myelopathy or radiculopathy, lumbosacral region: Secondary | ICD-10-CM | POA: Diagnosis not present

## 2015-02-28 ENCOUNTER — Emergency Department: Payer: Self-pay | Admitting: Emergency Medicine

## 2015-03-15 ENCOUNTER — Ambulatory Visit: Payer: Self-pay | Admitting: Pain Medicine

## 2015-03-23 ENCOUNTER — Ambulatory Visit
Admit: 2015-03-23 | Disposition: A | Discharge status: Home / Self Care | Payer: Self-pay | Attending: Pain Medicine | Admitting: Pain Medicine

## 2015-03-30 DIAGNOSIS — Z22322 Carrier or suspected carrier of Methicillin resistant Staphylococcus aureus: Secondary | ICD-10-CM

## 2015-04-14 ENCOUNTER — Ambulatory Visit
Admit: 2015-04-14 | Disposition: A | Discharge status: Home / Self Care | Payer: Self-pay | Attending: Pain Medicine | Admitting: Pain Medicine

## 2015-04-16 NOTE — Op Note (Signed)
PATIENT NAME:  Mark Snow, Mark Snow MR#:  [id number] DATE OF BIRTH:  25-Jan-1971  DATE OF PROCEDURE:  03/22/2014  [PREOPERATIVE DIAGNOSIS: Right hip and left groin abscess.   POSTOPERATIVE DIAGNOSIS: Right hip and left groin abscess.   OPERATIVE PROCEDURES:   1. Excision necrotic tissue from right hip.  2. Incision and drainage left groin abscess.   OPERATIVE PROCEDURE: Earline MayotteJeffrey W. Lashanti Chambless, MD   ANESTHESIA: General endotracheal under Dr. Noralyn Pickarroll.   ESTIMATED BLOOD LOSS: 25 to 50 mL.   CLINICAL NOTE: This 44 year old male reports he was usual health until 6 days ago when he thought he had suffered an insect bite in the left groin area. Over the next 48 hours he noticed increasing pain in this area as well as development of a second area on the right posterior lateral hip. He was evaluated by his primary care physician on March 27 and March 30 with progressive pain and swelling in spite of the initiation of oral Bactrim. Examination today showed a large inflammatory process in the left groin/perineal area without a clear source and incomplete drainage. Also it noted was a 1.5-cm of necrotic tissue with a 5-cm area of surrounding erythema on the right posterolateral gluteal area. He is brought to the operating room for planned debridement and drainage.   OPERATIVE NOTE: The patient received Invanz prior to the procedure. He underwent general endotracheal anesthesia by rapid sequence technique under the care of Yves DillPaul Carroll, MD. A saline bag  was placed under the right hip to allow exposure of the right posterior lateral area of necrotic tissue. This was prepped with Betadine solution and draped. The necrotic tissue was excised with the underlying adipose tissue to clean viable tissue. Culture was obtained. Sample was sent for histology. Hemostasis with electrocautery. A saline-dampened sponge was packed into the wound followed by dry 4 x 4 dressing.   The patient was placed in dorsal lithotomy stirrups.  Care was taken to pad appropriate pressure points. The perineum was then prepped with Betadine solution and draped. The area of spontaneous drainage 4 cm off the midline and midway between the base of the scrotum and the rectum was probed with a cavity extending superiorly towards the lower abdomen extending up 6 to 7-cm. Minimal purulent material was noted in this area. The area was debrided with a sponge and a Penrose drain brought out through the original draining sinus and anchored in place to the proximal tissue with 3-0 nylon. The abscess cavity was noted to extend posteriorly for about 3 cm. This was debrided with the sponge and a second Penrose drain placed and again anchored in place with 3-0 nylon. Palpation of the perianal tissue showed no inflammatory process to suggest this began as a perirectal abscess. Dry dressing was applied and the patient was taken to the recovery room in stable condition.    ____________________________ Earline MayotteJeffrey W. Marvell Stavola, MD jwb:lt D: 03/22/2014 21:34:54 ET T: 03/23/2014 05:47:20 ET JOB#: [id number]  cc: Earline MayotteJeffrey W. Jemario Poitras, MD, <Dictator> Marisue IvanKanhka Linthavong, MD Latavion Halls Brion AlimentW Katriana Dortch MD ELECTRONICALLY SIGNED 03/24/2014 8:58

## 2015-04-27 ENCOUNTER — Ambulatory Visit: Payer: Self-pay | Admitting: Pain Medicine

## 2015-05-04 ENCOUNTER — Ambulatory Visit: Payer: Self-pay | Admitting: Pain Medicine

## 2015-05-04 ENCOUNTER — Other Ambulatory Visit: Payer: Self-pay | Admitting: Pain Medicine

## 2015-05-12 ENCOUNTER — Encounter: Payer: Self-pay | Admitting: Pain Medicine

## 2015-05-12 ENCOUNTER — Telehealth: Payer: Self-pay | Admitting: Pain Medicine

## 2015-05-12 ENCOUNTER — Ambulatory Visit: Payer: Medicare Other | Attending: Pain Medicine | Admitting: Pain Medicine

## 2015-05-12 VITALS — BP 125/79 | HR 98 | Temp 97.8°F | Resp 16 | Ht 69.0 in | Wt 185.0 lb

## 2015-05-12 DIAGNOSIS — M5030 Other cervical disc degeneration, unspecified cervical region: Secondary | ICD-10-CM | POA: Insufficient documentation

## 2015-05-12 DIAGNOSIS — M5481 Occipital neuralgia: Secondary | ICD-10-CM | POA: Insufficient documentation

## 2015-05-12 DIAGNOSIS — M542 Cervicalgia: Secondary | ICD-10-CM | POA: Diagnosis present

## 2015-05-12 DIAGNOSIS — M5136 Other intervertebral disc degeneration, lumbar region: Secondary | ICD-10-CM | POA: Insufficient documentation

## 2015-05-12 DIAGNOSIS — M47817 Spondylosis without myelopathy or radiculopathy, lumbosacral region: Secondary | ICD-10-CM

## 2015-05-12 DIAGNOSIS — M533 Sacrococcygeal disorders, not elsewhere classified: Secondary | ICD-10-CM | POA: Diagnosis not present

## 2015-05-12 DIAGNOSIS — I739 Peripheral vascular disease, unspecified: Secondary | ICD-10-CM | POA: Diagnosis not present

## 2015-05-12 DIAGNOSIS — M51369 Other intervertebral disc degeneration, lumbar region without mention of lumbar back pain or lower extremity pain: Secondary | ICD-10-CM | POA: Insufficient documentation

## 2015-05-12 DIAGNOSIS — M48062 Spinal stenosis, lumbar region with neurogenic claudication: Secondary | ICD-10-CM

## 2015-05-12 DIAGNOSIS — M5116 Intervertebral disc disorders with radiculopathy, lumbar region: Secondary | ICD-10-CM | POA: Insufficient documentation

## 2015-05-12 DIAGNOSIS — M47812 Spondylosis without myelopathy or radiculopathy, cervical region: Secondary | ICD-10-CM | POA: Insufficient documentation

## 2015-05-12 DIAGNOSIS — M545 Low back pain: Secondary | ICD-10-CM | POA: Diagnosis present

## 2015-05-12 MED ORDER — CYCLOBENZAPRINE HCL 10 MG PO TABS: ORAL_TABLET | ORAL | Status: DC

## 2015-05-12 MED ORDER — OXYCODONE HCL ER 30 MG PO T12A: EXTENDED_RELEASE_TABLET | ORAL | Status: DC

## 2015-05-12 MED ORDER — OXYCODONE HCL 10 MG PO TABS: ORAL_TABLET | ORAL | Status: DC

## 2015-05-12 NOTE — Patient Instructions (Addendum)
Continue present medication oxycodone and stop Horizant which you have already discontinued. Begin Flexeril and please note that the Flexeril may cause drowsiness, confusion, and other undesirable side effects  We'll proceed with interventional treatment next week as scheduled for Wednesday, 05/18/2015  F/U PCP for evaliation of  BP and general medical  condition.  F/U surgical evaluation.  F/U neurological evaluation.  May consider radiofrequency rhizolysis or intraspinal procedures pending response to present treatment and F/U evaluation.  Patient to call [facility] should patient have concerns prior to scheduled return appointment.

## 2015-05-12 NOTE — Progress Notes (Signed)
Discharged at 1530, ambulatory.

## 2015-05-12 NOTE — Progress Notes (Signed)
   Subjective:    Patient ID: Mark Snow, male    DOB: 1971-09-19, 44 y.o.   MRN: [id number]  HPI    Review of Systems     Objective:   Physical Exam        Assessment & Plan:

## 2015-05-12 NOTE — Telephone Encounter (Signed)
Patient last prescribed meds on 04-2l -2016 and patient will run out on 5-21 by purple chart.

## 2015-05-12 NOTE — Telephone Encounter (Signed)
Nurses and Morrie SheldonAshley  Please call patient to see if patient will come Monday, May 23 for procedure and medication refill

## 2015-05-12 NOTE — Progress Notes (Signed)
   Subjective:    Patient ID: Mark Snow, male    DOB: June 26, 1971, 44 y.o.   MRN: [id number]  HPI  Patient is 44 year old Mark [facility] and treatment of pain involving the lumbar lower extremity region and the cervical and upper extremity region was severely disabling lumbar lower extremity pain radiating to the right lower extremity region especially. His is status post surgical intervention surgical intervention of both the cervical and lumbar regions. With severely disabling lumbar lower extremity pain involving the right lower extremity region especially. We will proceed with interventional treatment consisting of lumbar epidural steroid injection felt to be medically necessary procedure at time return appointment in attempt to decrease severity of symptoms minimize risk of medication escalation and hopefully retard progression of patient's symptoms patient is understanding and in agreement with this treatment plan is and hopefully be able to avoid additional surgical intervention as well we'll begin Flexeril and patient will avoid the use of Horizant which he had already discontinued. Patient will continue oxycodone as prescribed. We will proceed with lumbar epidural steroid injection at time return appointment as discussed. The patient is understanding and in agreement with plan.        Review of Systems     Objective:   Physical Exam   Tenderness of the splenius capitis muscles of moderate degree with moderate tenderness of the occipitalis muscles as well as. There was well-healed surgical scar the cervical region with limited range of motion of the cervical spine. No increased warmth or erythema in the region of the scar. Palpation of the acromioclavicular and glenohumeral joint regions were tends to palpation of mild to moderate degree with patient being with what appeared to be slightly decreased grip strength Tinel and Phalen's  maneuver appeared to be with mild discomfort. There appeared to be unremarkable Spurling's maneuver. Patient will thoracic facet thoracic paraspinal muscles reproduced pain of moderate degree with moderately severe spasms of the lower thoracic region with palpation over the lumbar paraspinal muscular lumbar facet region reproduced severely disabling pain severely limited range of motion of the lumbar spine. Raising decrease to approximately 20 on the right patient will increase of pain with dorsiflexion noted as well as decreased EHL strength especially decreased sensation of the L5 dermatomal distribution. Tenderness of the PSIS and PII S regions right greater than the left. Negative clonus negative Homans. 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 Oxycodonet.. Patient  has discontinued Horizant. We will begin Flexeril which patient has taken previously. She was cautioned regarding respiratory depression confusion and other undesirable side effects which can occur with his medications.   Lumbar epidural steroid injection on 05/18/2015 to be performed as planned  F/U PCP for evaliation of  BP and general medical  condition.  F/U surgical evaluation.  F/U neurological evaluation.  May consider radiofrequency rhizolysis or intraspinal procedures pending response to present treatment and F/U evaluation.  [facility] should patient have concerns prior to scheduled return appointment.

## 2015-05-12 NOTE — Telephone Encounter (Signed)
Says he will be out of meds 5-21  Last script gotten 4-22  Next appt 05-18-15 for procedure

## 2015-05-18 ENCOUNTER — Encounter: Payer: Self-pay | Admitting: Pain Medicine

## 2015-05-18 ENCOUNTER — Ambulatory Visit: Payer: Medicare Other | Attending: Pain Medicine | Admitting: Pain Medicine

## 2015-05-18 VITALS — BP 102/69 | HR 76 | Temp 98.2°F | Resp 16 | Ht 69.0 in | Wt 185.0 lb

## 2015-05-18 DIAGNOSIS — M47817 Spondylosis without myelopathy or radiculopathy, lumbosacral region: Secondary | ICD-10-CM

## 2015-05-18 DIAGNOSIS — M5030 Other cervical disc degeneration, unspecified cervical region: Secondary | ICD-10-CM | POA: Diagnosis not present

## 2015-05-18 DIAGNOSIS — M545 Low back pain: Secondary | ICD-10-CM | POA: Diagnosis present

## 2015-05-18 DIAGNOSIS — M5136 Other intervertebral disc degeneration, lumbar region: Secondary | ICD-10-CM | POA: Diagnosis not present

## 2015-05-18 DIAGNOSIS — Z9889 Other specified postprocedural states: Secondary | ICD-10-CM | POA: Diagnosis not present

## 2015-05-18 DIAGNOSIS — M47812 Spondylosis without myelopathy or radiculopathy, cervical region: Secondary | ICD-10-CM

## 2015-05-18 DIAGNOSIS — M48062 Spinal stenosis, lumbar region with neurogenic claudication: Secondary | ICD-10-CM

## 2015-05-18 MED ORDER — CIPROFLOXACIN HCL 500 MG PO TABS: ORAL_TABLET | ORAL | Status: DC

## 2015-05-18 NOTE — Progress Notes (Signed)
Discharged at 1030, ambulatory.

## 2015-05-18 NOTE — Progress Notes (Signed)
   Subjective:    Patient ID: Mark Snow, male    DOB: 02/19/1971, 44 y.o.   MRN: [id number]  HPI   the patient is 44 year old gentleman [facility] further evaluation and treatment of pain involving the region of the lower back and lower extremity regions with prior surgical intervention of the cervical region and lumbar regions. Plan was to proceed with lumbar epidural steroid injection on today's visit which was counseled due to return regarding right gluteal region. There is concern regarding underlying process which may be present and we will have patient undergo further evaluation of the right gluteal region due to concern regarding underlying infection of other abnormalities the patient was understanding and in agreement with suggested treatment plan.    Review of Systems     Objective:   Physical Exam    there was tenderness to palpation of the splenius capitis and occipitalis muscles of mild degree. There was mild tenderness of the acromioclavicular and glenohumeral joint region of mild degree. Palpation over the cervical facets paraspinal musculature and cervical facet region reproduced mild discomfort. There was well-healed surgical scar  of the cervical region without increased warmth and erythema in the region of the scar . The patient was with limited range of motion of the cervical region with unremarkable Spurling's maneuver. There was tenderness over the lumbar paraspinal musculature region lumbar facet region of moderate degree with limited range of motion of the lumbar spine and with palpation over the lumbar facets reproducing severe discomfort. There was well-healed scars of the lumbar region without increased warmth erythema in the region of the scar. There was tenderness of the right gluteal musculature region with the right gluteal musculature region appearing somewhat swollen compared to the left side no increased warmth or erythema was noted  and the patient was afebrile on today's visit. Straight leg raising was decreased on the left as well as on the right there was  Question decreased sensation along the L5 dermatomal distribution. There was negative Homans and negative clonus. Abdomen nontender no costovertebral angle tenderness noted.       Assessment & Plan:     degenerative disc disease of the lumbar spine   Degenerative disc disease of the cervical spine   Status post surgical intervention of the lumbar region with prior laminectomy and decompression   There is postsurgical intervention of the cervical region   questionable  Infection      Plan   Continue present medications and antibiotic. Patient was given a prescription for Cipro on today's visit   F/U PCP for evaliation of  BP and general medical  condition.  F/U surgical evaluation.  We will schedule surgical evaluation of the gluteal region. There is concern regarding underlying infection  F/U nrurological evaluation.  May consider radiofrequency rhizolysis or intraspinal procedures pending response to present treatment and F/U evaluation.  [facility] should patient have concerns prior to scheduled return appointment.   of the gluteal musculature region

## 2015-05-18 NOTE — Patient Instructions (Signed)
Continue present medications.. Begin Cipro as instructed and see Dr. Juanetta [name] for evaluation of region we discussed. Please see Dr. Juanetta [name] this week   F/U PCP for evaliation of  BP and general medical  condition.. Follow-up Dr. Juanetta [name] for evaluation of questionable infection in the region of the gluteal musculature region on the right   F/U surgical evaluation.. We may need to proceed with surgical evaluation of questionable infection of the gluteal musculature region. Please see Dr. Juanetta [name] her primary care physician and discuss need for surgical evaluation of the discussed area the gluteal musculature region  F/U neurological evaluation.  May consider radiofrequency rhizolysis or intraspinal procedures pending response to present treatment and F/U evaluation.  [facility] to call [facility] should patient have concerns prior to scheduled return appointment. .Marland Kitchen

## 2015-05-18 NOTE — Progress Notes (Signed)
   Subjective:    Patient ID: Mark Snow, male    DOB: 11-05-1971, 44 y.o.   MRN: [id number]  HPI    Review of Systems     Objective:   Physical Exam        Assessment & Plan:

## 2015-05-19 ENCOUNTER — Telehealth: Payer: Self-pay | Admitting: *Deleted

## 2015-05-19 NOTE — Telephone Encounter (Signed)
Left voicemail to return call for any problems/ concerns post procedure.

## 2015-06-09 ENCOUNTER — Encounter: Payer: Self-pay | Admitting: Pain Medicine

## 2015-06-09 ENCOUNTER — Ambulatory Visit: Payer: Medicare Other | Attending: Pain Medicine | Admitting: Pain Medicine

## 2015-06-09 VITALS — BP 132/81 | HR 98 | Temp 98.4°F | Resp 16 | Ht 69.0 in | Wt 185.0 lb

## 2015-06-09 DIAGNOSIS — M533 Sacrococcygeal disorders, not elsewhere classified: Secondary | ICD-10-CM | POA: Insufficient documentation

## 2015-06-09 DIAGNOSIS — M5126 Other intervertebral disc displacement, lumbar region: Secondary | ICD-10-CM | POA: Insufficient documentation

## 2015-06-09 DIAGNOSIS — L0231 Cutaneous abscess of buttock: Secondary | ICD-10-CM | POA: Diagnosis not present

## 2015-06-09 DIAGNOSIS — M546 Pain in thoracic spine: Secondary | ICD-10-CM | POA: Diagnosis present

## 2015-06-09 DIAGNOSIS — M48062 Spinal stenosis, lumbar region with neurogenic claudication: Secondary | ICD-10-CM

## 2015-06-09 DIAGNOSIS — M47812 Spondylosis without myelopathy or radiculopathy, cervical region: Secondary | ICD-10-CM

## 2015-06-09 DIAGNOSIS — M542 Cervicalgia: Secondary | ICD-10-CM | POA: Diagnosis present

## 2015-06-09 DIAGNOSIS — M5136 Other intervertebral disc degeneration, lumbar region: Secondary | ICD-10-CM | POA: Diagnosis not present

## 2015-06-09 DIAGNOSIS — M4806 Spinal stenosis, lumbar region: Secondary | ICD-10-CM | POA: Diagnosis not present

## 2015-06-09 DIAGNOSIS — M62838 Other muscle spasm: Secondary | ICD-10-CM | POA: Diagnosis not present

## 2015-06-09 DIAGNOSIS — Z9889 Other specified postprocedural states: Secondary | ICD-10-CM | POA: Insufficient documentation

## 2015-06-09 DIAGNOSIS — M5030 Other cervical disc degeneration, unspecified cervical region: Secondary | ICD-10-CM

## 2015-06-09 DIAGNOSIS — M51369 Other intervertebral disc degeneration, lumbar region without mention of lumbar back pain or lower extremity pain: Secondary | ICD-10-CM

## 2015-06-09 DIAGNOSIS — M47817 Spondylosis without myelopathy or radiculopathy, lumbosacral region: Secondary | ICD-10-CM

## 2015-06-09 MED ORDER — CYCLOBENZAPRINE HCL 10 MG PO TABS: ORAL_TABLET | ORAL | Status: DC

## 2015-06-09 MED ORDER — OXYCODONE HCL 10 MG PO TABS: ORAL_TABLET | ORAL | Status: DC

## 2015-06-09 NOTE — Progress Notes (Signed)
Discharged to home ambulatory with script in hand for oxycodone.  Pre procedure instructions given with teach back 3 done.  Denies blood thinners.

## 2015-06-09 NOTE — Progress Notes (Signed)
Safety precautions to be maintained throughout the outpatient stay will include: orient to surroundings, keep bed in low position, maintain call bell within reach at all times, provide assistance with transfer out of bed and ambulation.  

## 2015-06-09 NOTE — Patient Instructions (Addendum)
Smoking Cessation Quitting smoking is important to your health and has many advantages. However, it is not always easy to quit since nicotine is a very addictive drug. Oftentimes, people try 3 times or more before being able to quit. This document explains the best ways for you to prepare to quit smoking. Quitting takes hard work and a lot of effort, but you can do it. ADVANTAGES OF QUITTING SMOKING 1. You will live longer, feel better, and live better. 2. Your body will feel the impact of quitting smoking almost immediately. 1. Within 20 minutes, blood pressure decreases. Your pulse returns to its normal level. 2. After 8 hours, carbon monoxide levels in the blood return to normal. Your oxygen level increases. 3. After 24 hours, the chance of having a heart attack starts to decrease. Your breath, hair, and body stop smelling like smoke. 4. After 48 hours, damaged nerve endings begin to recover. Your sense of taste and smell improve. 5. After 72 hours, the body is virtually free of nicotine. Your bronchial tubes relax and breathing becomes easier. 6. After 2 to 12 weeks, lungs can hold more air. Exercise becomes easier and circulation improves. 3. The risk of having a heart attack, stroke, cancer, or lung disease is greatly reduced. 1. After 1 year, the risk of coronary heart disease is cut in half. 2. After 5 years, the risk of stroke falls to the same as a nonsmoker. 3. After 10 years, the risk of lung cancer is cut in half and the risk of other cancers decreases significantly. 4. After 15 years, the risk of coronary heart disease drops, usually to the level of a nonsmoker. 4. If you are pregnant, quitting smoking will improve your chances of having a healthy baby. 5. The people you live with, especially any children, will be healthier. 6. You will have extra money to spend on things other than cigarettes. QUESTIONS TO THINK ABOUT BEFORE ATTEMPTING TO QUIT You may want to talk about your  answers with your health care provider. 1. Why do you want to quit? 2. If you tried to quit in the past, what helped and what did not? 3. What will be the most difficult situations for you after you quit? How will you plan to handle them? 4. Who can help you through the tough times? Your family? Friends? A health care provider? 5. What pleasures do you get from smoking? What ways can you still get pleasure if you quit? Here are some questions to ask your health care provider: 1. How can you help me to be successful at quitting? 2. What medicine do you think would be best for me and how should I take it? 3. What should I do if I need more help? 4. What is smoking withdrawal like? How can I get information on withdrawal? GET READY 1. Set a quit date. 2. Change your environment by getting rid of all cigarettes, ashtrays, matches, and lighters in your home, car, or work. Do not let people smoke in your home. 3. Review your past attempts to quit. Think about what worked and what did not. GET SUPPORT AND ENCOURAGEMENT You have a better chance of being successful if you have help. You can get support in many ways. 1. Tell your family, friends, and coworkers that you are going to quit and need their support. Ask them not to smoke around you. 2. Get individual, group, or telephone counseling and support. Programs are available at [employer] and health centers. Call  your local health department for information about programs in your area. 3. Spiritual beliefs and practices may help some smokers quit. 4. Download a "quit meter" on your computer to keep track of quit statistics, such as how long you have gone without smoking, cigarettes not smoked, and money saved. 5. Get a self-help book about quitting smoking and staying off tobacco. LEARN NEW SKILLS AND BEHAVIORS  Distract yourself from urges to smoke. Talk to someone, go for a walk, or occupy your time with a task.  Change your normal routine.  Take a different route to work. Drink tea instead of coffee. Eat breakfast in a different place.  Reduce your stress. Take a hot bath, exercise, or read a book.  Plan something enjoyable to do every day. Reward yourself for not smoking.  Explore interactive web-based programs that specialize in helping you quit. GET MEDICINE AND USE IT CORRECTLY Medicines can help you stop smoking and decrease the urge to smoke. Combining medicine with the above behavioral methods and support can greatly increase your chances of successfully quitting smoking.  Nicotine replacement therapy helps deliver nicotine to your body without the negative effects and risks of smoking. Nicotine replacement therapy includes nicotine gum, lozenges, inhalers, nasal sprays, and skin patches. Some may be available over-the-counter and others require a prescription.  Antidepressant medicine helps people abstain from smoking, but how this works is unknown. This medicine is available by prescription.  Nicotinic receptor partial agonist medicine simulates the effect of nicotine in your brain. This medicine is available by prescription. Ask your health care provider for advice about which medicines to use and how to use them based on your health history. Your health care provider will tell you what side effects to look out for if you choose to be on a medicine or therapy. Carefully read the information on the package. Do not use any other product containing nicotine while using a nicotine replacement product.  RELAPSE OR DIFFICULT SITUATIONS Most relapses occur within the first 3 months after quitting. Do not be discouraged if you start smoking again. Remember, most people try several times before finally quitting. You may have symptoms of withdrawal because your body is used to nicotine. You may crave cigarettes, be irritable, feel very hungry, cough often, get headaches, or have difficulty concentrating. The withdrawal symptoms are  only temporary. They are strongest when you first quit, but they will go away within 10-14 days. To reduce the chances of relapse, try to:  Avoid drinking alcohol. Drinking lowers your chances of successfully quitting.  Reduce the amount of caffeine you consume. Once you quit smoking, the amount of caffeine in your body increases and can give you symptoms, such as a rapid heartbeat, sweating, and anxiety.  Avoid smokers because they can make you want to smoke.  Do not let weight gain distract you. Many smokers will gain weight when they quit, usually less than 10 pounds. Eat a healthy diet and stay active. You can always lose the weight gained after you quit.  Find ways to improve your mood other than smoking. FOR MORE INFORMATION  www.smokefree.gov  Document Released: 12/04/2001 Document Revised: 04/26/2014 Document Reviewed: 03/20/2012 [facility] Patient Information 2015 [place], [place]. This information is not intended to replace advice given to you by your health care provider. Make sure you discuss any questions you have with your health care provider    Continue present medications.  F/U PCP for evaliation of  BP and general medical  Condition.  Lumbar epidural  steroid injection Monday, 06/20/2015  F/U surgical evaluation.  F/U neurological evaluation.  May consider radiofrequency rhizolysis or intraspinal procedures pending response to present treatment and F/U evaluation.  Patient to call [facility] should patient have concerns prior to scheduled return appointment.  GENERAL RISKS AND COMPLICATIONS  What are the risk, side effects and possible complications? Generally speaking, most procedures are safe.  However, with any procedure there are risks, side effects, and the possibility of complications.  The risks and complications are dependent upon the sites that are lesioned, or the type of nerve block to be performed.  The closer the procedure is to the spine, the  more serious the risks are.  Great care is taken when placing the radio frequency needles, block needles or lesioning probes, but sometimes complications can occur. 1. Infection: Any time there is an injection through the skin, there is a risk of infection.  This is why sterile conditions are used for these blocks.  There are four possible types of infection. 1. Localized skin infection. 2. Central Nervous System Infection-This can be in the form of Meningitis, which can be deadly. 3. Epidural Infections-This can be in the form of an epidural abscess, which can cause pressure inside of the spine, causing compression of the spinal cord with subsequent paralysis. This would require an emergency surgery to decompress, and there are no guarantees that the patient would recover from the paralysis. 4. Discitis-This is an infection of the intervertebral discs.  It occurs in about 1% of discography procedures.  It is difficult to treat and it may lead to surgery.        2. Pain: the needles have to go through skin and soft tissues, will cause soreness.       3. Damage to internal structures:  The nerves to be lesioned may be near blood vessels or    other nerves which can be potentially damaged.       4. Bleeding: Bleeding is more common if the patient is taking blood thinners such as  aspirin, Coumadin, Ticiid, Plavix, etc., or if he/she have some genetic predisposition  such as hemophilia. Bleeding into the spinal canal can cause compression of the spinal  cord with subsequent paralysis.  This would require an emergency surgery to  decompress and there are no guarantees that the patient would recover from the  paralysis.       5. Pneumothorax:  Puncturing of a lung is a possibility, every time a needle is introduced in  the area of the chest or upper back.  Pneumothorax refers to free air around the  collapsed lung(s), inside of the thoracic cavity (chest cavity).  Another two possible  complications related to  a similar event would include: Hemothorax and Chylothorax.   These are variations of the Pneumothorax, where instead of air around the collapsed  lung(s), you may have blood or chyle, respectively.       6. Spinal headaches: They may occur with any procedures in the area of the spine.       7. Persistent CSF (Cerebro-Spinal Fluid) leakage: This is a rare problem, but may occur  with prolonged intrathecal or epidural catheters either due to the formation of a fistulous  track or a dural tear.       8. Nerve damage: By working so close to the spinal cord, there is always a possibility of  nerve damage, which could be as serious as a permanent spinal cord injury with  paralysis.       9. Death:  Although rare, severe deadly allergic reactions known as "Anaphylactic  reaction" can occur to any of the medications used.      10. Worsening of the symptoms:  We can always make thing worse.  What are the chances of something like this happening? Chances of any of this occuring are extremely low.  By statistics, you have more of a chance of getting killed in a motor vehicle accident: while driving to the hospital than any of the above occurring .  Nevertheless, you should be aware that they are possibilities.  In general, it is similar to taking a shower.  Everybody knows that you can slip, hit your head and get killed.  Does that mean that you should not shower again?  Nevertheless always keep in mind that statistics do not mean anything if you happen to be on the wrong side of them.  Even if a procedure has a 1 (one) in a 1,000,000 (million) chance of going wrong, it you happen to be that one..Also, keep in mind that by statistics, you have more of a chance of having something go wrong when taking medications.  Who should not have this procedure? If you are on a blood thinning medication (e.g. Coumadin, Plavix, see list of "Blood Thinners"), or if you have an active infection going on, you should not have the  procedure.  If you are taking any blood thinners, please inform your physician.  How should I prepare for this procedure?  Do not eat or drink anything at least six hours prior to the procedure.  Bring a driver with you .  It cannot be a taxi.  Come accompanied by an adult that can drive you back, and that is strong enough to help you if your legs get weak or numb from the local anesthetic.  Take all of your medicines the morning of the procedure with just enough water to swallow them.  If you have diabetes, make sure that you are scheduled to have your procedure done first thing in the morning, whenever possible.  If you have diabetes, take only half of your insulin dose and notify our nurse that you have done so as soon as you arrive at the clinic.  If you are diabetic, but only take blood sugar pills (oral hypoglycemic), then do not take them on the morning of your procedure.  You may take them after you have had the procedure.  Do not take aspirin or any aspirin-containing medications, at least eleven (11) days prior to the procedure.  They may prolong bleeding.  Wear loose fitting clothing that may be easy to take off and that you would not mind if it got stained with Betadine or blood.  Do not wear any jewelry or perfume  Remove any nail coloring.  It will interfere with some of our monitoring equipment.  NOTE: Remember that this is not meant to be interpreted as a complete list of all possible complications.  Unforeseen problems may occur.  BLOOD THINNERS The following drugs contain aspirin or other products, which can cause increased bleeding during surgery and should not be taken for 2 weeks prior to and 1 week after surgery.  If you should need take something for relief of minor pain, you may take acetaminophen which is found in Tylenol,m Datril, Anacin-3 and Panadol. It is not blood thinner. The products listed below are.  Do not take any of the products listed below  in  addition to any listed on your instruction sheet.  A.P.C or A.P.C with Codeine Codeine Phosphate Capsules #3 Ibuprofen Ridaura  ABC compound Congesprin Imuran rimadil  Advil Cope Indocin Robaxisal  Alka-Seltzer Effervescent Pain Reliever and Antacid Coricidin or Coricidin-D  Indomethacin Rufen  Alka-Seltzer plus Cold Medicine Cosprin Ketoprofen S-A-C Tablets  Anacin Analgesic Tablets or Capsules Coumadin Korlgesic Salflex  Anacin Extra Strength Analgesic tablets or capsules CP-2 Tablets Lanoril Salicylate  Anaprox Cuprimine Capsules Levenox Salocol  Anexsia-D Dalteparin Magan Salsalate  Anodynos Darvon compound Magnesium Salicylate Sine-off  Ansaid Dasin Capsules Magsal Sodium Salicylate  Anturane Depen Capsules Marnal Soma  APF Arthritis pain formula Dewitt's Pills Measurin Stanback  Argesic Dia-Gesic Meclofenamic Sulfinpyrazone  Arthritis Bayer Timed Release Aspirin Diclofenac Meclomen Sulindac  Arthritis pain formula Anacin Dicumarol Medipren Supac  Analgesic (Safety coated) Arthralgen Diffunasal Mefanamic Suprofen  Arthritis Strength Bufferin Dihydrocodeine Mepro Compound Suprol  Arthropan liquid Dopirydamole Methcarbomol with Aspirin Synalgos  ASA tablets/Enseals Disalcid Micrainin Tagament  Ascriptin Doan's Midol Talwin  Ascriptin A/D Dolene Mobidin Tanderil  Ascriptin Extra Strength Dolobid Moblgesic Ticlid  Ascriptin with Codeine Doloprin or Doloprin with Codeine Momentum Tolectin  Asperbuf Duoprin Mono-gesic Trendar  Aspergum Duradyne Motrin or Motrin IB Triminicin  Aspirin plain, buffered or enteric coated Durasal Myochrisine Trigesic  Aspirin Suppositories Easprin Nalfon Trillsate  Aspirin with Codeine Ecotrin Regular or Extra Strength Naprosyn Uracel  Atromid-S Efficin Naproxen Ursinus  Auranofin Capsules Elmiron Neocylate Vanquish  Axotal Emagrin Norgesic Verin  Azathioprine Empirin or Empirin with Codeine Normiflo Vitamin E  Azolid Emprazil Nuprin Voltaren  Bayer  Aspirin plain, buffered or children's or timed BC Tablets or powders Encaprin Orgaran Warfarin Sodium  Buff-a-Comp Enoxaparin Orudis Zorpin  Buff-a-Comp with Codeine Equegesic Os-Cal-Gesic   Buffaprin Excedrin plain, buffered or Extra Strength Oxalid   Bufferin Arthritis Strength Feldene Oxphenbutazone   Bufferin plain or Extra Strength Feldene Capsules Oxycodone with Aspirin   Bufferin with Codeine Fenoprofen Fenoprofen Pabalate or Pabalate-SF   Buffets II Flogesic Panagesic   Buffinol plain or Extra Strength Florinal or Florinal with Codeine Panwarfarin   Buf-Tabs Flurbiprofen Penicillamine   Butalbital Compound Four-way cold tablets Penicillin   Butazolidin Fragmin Pepto-Bismol   Carbenicillin Geminisyn Percodan   Carna Arthritis Reliever Geopen Persantine   Carprofen Gold's salt Persistin   Chloramphenicol Goody's Phenylbutazone   Chloromycetin Haltrain Piroxlcam   Clmetidine heparin Plaquenil   Cllnoril Hyco-pap Ponstel   Clofibrate Hydroxy chloroquine Propoxyphen         Before stopping any of these medications, be sure to consult the physician who ordered them.  Some, such as Coumadin (Warfarin) are ordered to prevent or treat serious conditions such as "deep thrombosis", "pumonary embolisms", and other heart problems.  The amount of time that you may need off of the medication may also vary with the medication and the reason for which you were taking it.  If you are taking any of these medications, please make sure you notify your pain physician before you undergo any procedures.         Epidural Steroid Injection Patient Information  Description: The epidural space surrounds the nerves as they exit the spinal cord.  In some patients, the nerves can be compressed and inflamed by a bulging disc or a tight spinal canal (spinal stenosis).  By injecting steroids into the epidural space, we can bring irritated nerves into direct contact with a potentially helpful medication.   These steroids act directly on the irritated nerves and can reduce swelling and inflammation  which often leads to decreased pain.  Epidural steroids may be injected anywhere along the spine and from the neck to the low back depending upon the location of your pain.   After numbing the skin with local anesthetic (like Novocaine), a small needle is passed into the epidural space slowly.  You may experience a sensation of pressure while this is being done.  The entire block usually last less than 10 minutes.  Conditions which may be treated by epidural steroids:   Low back and leg pain  Neck and arm pain  Spinal stenosis  Post-laminectomy syndrome  Herpes zoster (shingles) pain  Pain from compression fractures  Preparation for the injection:  6. Do not eat any solid food or dairy products within 6 hours of your appointment.  7. You may drink clear liquids up to 2 hours before appointment.  Clear liquids include water, black coffee, juice or soda.  No milk or cream please. 8. You may take your regular medication, including pain medications, with a sip of water before your appointment  Diabetics should hold regular insulin (if taken separately) and take 1/2 normal NPH dos the morning of the procedure.  Carry some sugar containing items with you to your appointment. 9. A driver must accompany you and be prepared to drive you home after your procedure.  10. Bring all your current medications with your. 11. An IV may be inserted and sedation may be given at the discretion of the physician.   12. A blood pressure cuff, EKG and other monitors will often be applied during the procedure.  Some patients may need to have extra oxygen administered for a short period. 13. You will be asked to provide medical information, including your allergies, prior to the procedure.  We must know immediately if you are taking blood thinners (like Coumadin/Warfarin)  Or if you are allergic to IV iodine contrast (dye).  We must know if you could possible be pregnant.  Possible side-effects:  Bleeding from needle site  Infection (rare, may require surgery)  Nerve injury (rare)  Numbness & tingling (temporary)  Difficulty urinating (rare, temporary)  Spinal headache ( a headache worse with upright posture)  Light -headedness (temporary)  Pain at injection site (several days)  Decreased blood pressure (temporary)  Weakness in arm/leg (temporary)  Pressure sensation in back/neck (temporary)  Call if you experience:  Fever/chills associated with headache or increased back/neck pain.  Headache worsened by an upright position.  New onset weakness or numbness of an extremity below the injection site  Hives or difficulty breathing (go to the emergency room)  Inflammation or drainage at the infection site  Severe back/neck pain  Any new symptoms which are concerning to you  Please note:  Although the local anesthetic injected can often make your back or neck feel good for several hours after the injection, the pain will likely return.  It takes 3-7 days for steroids to work in the epidural space.  You may not notice any pain relief for at least that one week.  If effective, we will often do a series of three injections spaced 3-6 weeks apart to maximally decrease your pain.  After the initial series, we generally will wait several months before considering a repeat injection of the same type.  If you have any questions, please call [phone] [facility]

## 2015-06-09 NOTE — Progress Notes (Signed)
Subjective:    Patient ID: Mark Snow, male    DOB: 10-24-1971, 44 y.o.   MRN: [id number]  HPI  Patient is 44 year old gentleman who [facility] for further evaluation and treatment of pain involving the neck and entire back upper and lower extremity regions with predominance pain involving the lower back and lower extremity region on the right at this time. There didn't concern regarding abscess of the gluteal region previously. Patient was scheduled for surgical evaluation of the region and was prescribed antibiotic therapy. Patient states that the soreness and swelling of the right buttocks was due to spasm. Patient states that he applied ice and that the spasm resolved. Patient states he is in hopes of being able to undergo interventional treatment for lower back lower extremity pain at time return appointment. We discussed further surgical evaluation and will proceed with interventional treatment lumbar epidural steroid injection at time return appointment in attempt to decrease severity of patient's symptoms, minimize progression of patient's symptoms, and hopefully avoid the need for more involved treatment. The patient was understanding and agreement status treatment plan.    Review of Systems     Objective:   Physical Exam   there was tenderness over the splenius capitis and occipitalis region of moderate degree with well-healed surgical scar the cervical region without increased warmth or erythema in the region of the scar. Patient appeared to be with slightly decreased grip strength. There was tenderness over the cervical and thoracic paraspinal musculature region and facet regions. Palpation of these regions reproduced moderate discomfort. There appeared to be unremarkable Spurling's maneuver. There was limited range of motion of the cervical spine. Palpation of the acromioclavicular and glenohumeral joint regions reproduced mild discomfort. There was  tenderness over the thoracic facet thoracic paraspinal musculature region without crepitus of the thoracic region noted. Palpation over the lumbar paraspinal musculature region lumbar facet region was associated with moderate moderately severe discomfort. Straight leg raising was limited to approximately 20 without a definite increased pain with dorsiflexion noted. Palpation of the lumbar facets reproducing severe discomfort. There was tenderness over the PSIS and PII S region. There was question decreased sensation of the L5 dermatomal distribution. DTRs were difficult to elicit patient had difficulty relaxing. Patient with difficulty attempt to stand on tiptoes and heels. Negative clonus negative Homans. Abdomen nontender and no costovertebral maintenance noted.      Assessment & Plan:   Degenerative disc disease lumbar spine Status post umbar laminectomy L2-3 with decompression of the central canal moderate right and left foraminal stenosis at L3-4 secondary to lateral disc protrusions and facet hypertrophy moderate foraminal stenosis at L45 secondary to acquired and congenital factors worse on the left mild bulging at L5-S1 without significant stenosis  Lumbar stenosis  Lumbar facet syndrome  Sacroiliac joint dysfunction   Degenerative disc disease cervical spine Status post C5-7 fusion without complicating features  Cervical facet syndrome  Gluteal abscess Resolved and without evidence of reoccurrence  Gluteal muscle spasm Resolved   Plan   Continue present medications. Flexeril and oxycodone  Lumbar epidural steroid injection to be performed at time return appointment  F/U PCP for evaliation of  BP and general medical  Condition.  CBC with differential to be obtained prior to lumbar epidural steroid injection  F/U surgical evaluation. Patient to undergo follow-up neurosurgical evaluation Dr. Marikay Alar as discussed  F/U neurological evaluation.  F/U surgical  evaluation of history of gluteal abscess without evidence of reoccurrence  May  consider radiofrequency rhizolysis or intraspinal procedures pending response to present treatment and F/U evaluation.  Patient to call [facility] should patient have concerns prior to scheduled return appointment.

## 2015-06-20 ENCOUNTER — Ambulatory Visit: Payer: Medicare Other | Attending: Pain Medicine | Admitting: Pain Medicine

## 2015-06-20 ENCOUNTER — Encounter: Payer: Self-pay | Admitting: Pain Medicine

## 2015-06-20 VITALS — BP 116/87 | HR 86 | Temp 98.2°F | Resp 16 | Ht 69.0 in | Wt 185.0 lb

## 2015-06-20 DIAGNOSIS — M5030 Other cervical disc degeneration, unspecified cervical region: Secondary | ICD-10-CM

## 2015-06-20 DIAGNOSIS — M47812 Spondylosis without myelopathy or radiculopathy, cervical region: Secondary | ICD-10-CM

## 2015-06-20 DIAGNOSIS — M4806 Spinal stenosis, lumbar region: Secondary | ICD-10-CM | POA: Insufficient documentation

## 2015-06-20 DIAGNOSIS — M48062 Spinal stenosis, lumbar region with neurogenic claudication: Secondary | ICD-10-CM

## 2015-06-20 DIAGNOSIS — M545 Low back pain: Secondary | ICD-10-CM | POA: Diagnosis present

## 2015-06-20 DIAGNOSIS — M47817 Spondylosis without myelopathy or radiculopathy, lumbosacral region: Secondary | ICD-10-CM

## 2015-06-20 DIAGNOSIS — M5126 Other intervertebral disc displacement, lumbar region: Secondary | ICD-10-CM | POA: Insufficient documentation

## 2015-06-20 DIAGNOSIS — M79604 Pain in right leg: Secondary | ICD-10-CM | POA: Diagnosis present

## 2015-06-20 DIAGNOSIS — M5136 Other intervertebral disc degeneration, lumbar region: Secondary | ICD-10-CM

## 2015-06-20 DIAGNOSIS — M79605 Pain in left leg: Secondary | ICD-10-CM | POA: Diagnosis present

## 2015-06-20 MED ORDER — MIDAZOLAM HCL 5 MG/5ML IJ SOLN
INTRAMUSCULAR | Status: AC
  Administered 2015-06-20: 5 mg via INTRAVENOUS
  Filled 2015-06-20: qty 5

## 2015-06-20 MED ORDER — LIDOCAINE HCL (PF) 1 % IJ SOLN
INTRAMUSCULAR | Status: AC
  Administered 2015-06-20: 12:00:00
  Filled 2015-06-20: qty 5

## 2015-06-20 MED ORDER — ORPHENADRINE CITRATE 30 MG/ML IJ SOLN
INTRAMUSCULAR | Status: AC
  Administered 2015-06-20: 12:00:00
  Filled 2015-06-20: qty 2

## 2015-06-20 MED ORDER — CIPROFLOXACIN IN D5W 400 MG/200ML IV SOLN
INTRAVENOUS | Status: AC
  Administered 2015-06-20: 400 mg via INTRAVENOUS
  Filled 2015-06-20: qty 200

## 2015-06-20 MED ORDER — FENTANYL CITRATE (PF) 100 MCG/2ML IJ SOLN
INTRAMUSCULAR | Status: AC
  Administered 2015-06-20: 100 ug
  Filled 2015-06-20: qty 2

## 2015-06-20 MED ORDER — BUPIVACAINE HCL (PF) 0.25 % IJ SOLN
INTRAMUSCULAR | Status: AC
  Administered 2015-06-20: 12:00:00
  Filled 2015-06-20: qty 30

## 2015-06-20 MED ORDER — SODIUM CHLORIDE 0.9 % IJ SOLN
INTRAMUSCULAR | Status: AC
  Administered 2015-06-20: 12:00:00
  Filled 2015-06-20: qty 20

## 2015-06-20 MED ORDER — TRIAMCINOLONE ACETONIDE 40 MG/ML IJ SUSP
INTRAMUSCULAR | Status: AC
  Administered 2015-06-20: 12:00:00
  Filled 2015-06-20: qty 1

## 2015-06-20 MED ORDER — CIPROFLOXACIN HCL 250 MG PO TABS: 250.0000 mg | ORAL_TABLET | Freq: Two times a day (BID) | ORAL | Status: DC

## 2015-06-20 NOTE — Progress Notes (Signed)
Safety precautions to be maintained throughout the outpatient stay will include: orient to surroundings, keep bed in low position, maintain call bell within reach at all times, provide assistance with transfer out of bed and ambulation.  

## 2015-06-20 NOTE — Progress Notes (Signed)
   Subjective:    Patient ID: Mark LeffKenneth S Husband, male    DOB: [birth year], 89 y.o.   MRN: [id number]  HPI  PROCEDURE PERFORMED: Lumbar epidural steroid injection   NOTE: The patient is a 44 y.o. male who returns to [facility] for further evaluation and treatment of pain involving the lumbar and lower extremity region. MRI was with evidence of prior laminectomy at L2 and L3 with decompression of the central canal. 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 Moderate right and left foraminal stenosis at L3-4 secondary to lateral disc protrusions and facet hypertrophy. Mild subarticular and mild to moderate foraminal stenosis at L4-L5 secondary to acquired and congenital factors is worse on the left. Mild disc bulging L5-S1 without significant stenosis. The risks, benefits, and expectations of the procedure have been discussed and explained to the patient who was understanding and in agreement with suggested treatment plan. We will proceed with interventional treatment as discussed and explained to the patient who is willing to proceed with procedure as planned.   DESCRIPTION OF PROCEDURE: Lumbar epidural steroid injection with IV Versed, IV fentanyl conscious sedation, EKG, blood pressure, pulse, and pulse oximetry monitoring. The procedure was performed with the patient in the prone position under fluoroscopic guidance. A local anesthetic skin wheal of 1.5% plain lidocaine was accomplished at proposed entry site. An 18-gauge Tuohy epidural needle was inserted at the L 5  vertebral body level right of the midline via loss-of-resistance technique with negative heme and negative CSF return. A total of 4 mL of Preservative-Free normal saline with 40 mg of Kenalog injected incrementally via epidurally placed needle. Needle removed.  The patient tolerated the injection well.   PLAN:   1. Medications: We will continue presently prescribed medications Flexeril and oxycodone 2. Will  consider modification of treatment regimen pending response to treatment rendered on today's visit and follow-up evaluation. 3. The patient is to follow-up with primary care physician regarding blood pressure and general medical condition status post lumbar epidural steroid injection performed on today's visit. 4. Surgical evaluation.. Patient will follow-up with Dr. Marikay Alaravid [name] as discussed 5. Neurological evaluation. 6. The patient may be a candidate for radiofrequency procedures, implantation device, and other treatment pending response to treatment and follow-up evaluation. 7. The patient has been advised to adhere to proper body mechanics and avoid activities which appear to aggravate condition. 8. The patient has been advised to call the [facility] prior to scheduled return appointment should there be significant change in condition or should there be sign  The patient is understanding and agrees with the suggested  treatment plan    Review of Systems     Objective:   Physical Exam        Assessment & Plan:

## 2015-06-20 NOTE — Patient Instructions (Addendum)
Continue present medications and antibiotics. Please obtain your antibiotic today and begin taking antibiotic today  F/U PCP for evaliation of  BP and general medical  condition.  F/U surgical evaluation as discussed  F/U neurological evaluation.  May consider radiofrequency rhizolysis or intraspinal procedures pending response to present treatment and F/U evaluation.  Patient to call [facility] should patient have concerns prior to scheduled return appointment.    Pain Management Discharge Instructions  General Discharge Instructions :  If you need to reach your doctor call: Monday-Friday 8:00 am - 4:00 pm at [phone] or toll free [phone].  After clinic hours [phone] to have operator reach doctor.  Bring all of your medication bottles to all your appointments in the pain clinic.  To cancel or reschedule your appointment with Pain Management please remember to call 24 hours in advance to avoid a fee.  Refer to the educational materials which you have been given on: General Risks, I had my Procedure. Discharge Instructions, Post Sedation.  Post Procedure Instructions:  The drugs you were given will stay in your system until tomorrow, so for the next 24 hours you should not drive, make any legal decisions or drink any alcoholic beverages.  You may eat anything you prefer, but it is better to start with liquids then soups and crackers, and gradually work up to solid foods.  Please notify your doctor immediately if you have any unusual bleeding, trouble breathing or pain that is not related to your normal pain.  Depending on the type of procedure that was done, some parts of your body may feel week and/or numb.  This usually clears up by tonight or the next day.  Walk with the use of an assistive device or accompanied by an adult for the 24 hours.  You may use ice on the affected area for the first 24 hours.  Put ice in a Ziploc bag and cover with a towel  and place against area 15 minutes on 15 minutes off.  You may switch to heat after 24 hours.  A prescription for CIPRO was sent to your pharmacy and should be available for pickup today.

## 2015-06-21 ENCOUNTER — Ambulatory Visit: Payer: Medicaid Other | Admitting: General Surgery

## 2015-06-21 ENCOUNTER — Telehealth: Payer: Self-pay | Admitting: *Deleted

## 2015-06-21 NOTE — Telephone Encounter (Signed)
Left voice mail

## 2015-07-06 ENCOUNTER — Ambulatory Visit: Payer: Medicare Other | Attending: Pain Medicine | Admitting: Pain Medicine

## 2015-07-06 ENCOUNTER — Encounter: Payer: Self-pay | Admitting: Pain Medicine

## 2015-07-06 VITALS — BP 121/79 | HR 96 | Temp 98.2°F | Resp 16 | Ht 69.0 in | Wt 185.0 lb

## 2015-07-06 DIAGNOSIS — M48062 Spinal stenosis, lumbar region with neurogenic claudication: Secondary | ICD-10-CM

## 2015-07-06 DIAGNOSIS — M5416 Radiculopathy, lumbar region: Secondary | ICD-10-CM | POA: Diagnosis not present

## 2015-07-06 DIAGNOSIS — M5481 Occipital neuralgia: Secondary | ICD-10-CM | POA: Diagnosis not present

## 2015-07-06 DIAGNOSIS — M533 Sacrococcygeal disorders, not elsewhere classified: Secondary | ICD-10-CM | POA: Insufficient documentation

## 2015-07-06 DIAGNOSIS — M5136 Other intervertebral disc degeneration, lumbar region: Secondary | ICD-10-CM | POA: Insufficient documentation

## 2015-07-06 DIAGNOSIS — M545 Low back pain: Secondary | ICD-10-CM | POA: Diagnosis present

## 2015-07-06 DIAGNOSIS — M4806 Spinal stenosis, lumbar region: Secondary | ICD-10-CM | POA: Diagnosis not present

## 2015-07-06 DIAGNOSIS — M5126 Other intervertebral disc displacement, lumbar region: Secondary | ICD-10-CM | POA: Diagnosis not present

## 2015-07-06 DIAGNOSIS — Z9889 Other specified postprocedural states: Secondary | ICD-10-CM | POA: Diagnosis not present

## 2015-07-06 DIAGNOSIS — M47817 Spondylosis without myelopathy or radiculopathy, lumbosacral region: Secondary | ICD-10-CM

## 2015-07-06 DIAGNOSIS — M79604 Pain in right leg: Secondary | ICD-10-CM | POA: Diagnosis present

## 2015-07-06 DIAGNOSIS — M79605 Pain in left leg: Secondary | ICD-10-CM | POA: Diagnosis present

## 2015-07-06 DIAGNOSIS — M5030 Other cervical disc degeneration, unspecified cervical region: Secondary | ICD-10-CM

## 2015-07-06 DIAGNOSIS — M47812 Spondylosis without myelopathy or radiculopathy, cervical region: Secondary | ICD-10-CM

## 2015-07-06 MED ORDER — OXYCODONE HCL 10 MG PO TABS: ORAL_TABLET | ORAL | Status: DC

## 2015-07-06 MED ORDER — CYCLOBENZAPRINE HCL 10 MG PO TABS: ORAL_TABLET | ORAL | Status: DC

## 2015-07-06 NOTE — Progress Notes (Signed)
   Subjective:    Patient ID: Mark LeffKenneth S Barkett, male    DOB: Mar 28, 1971, 44 y.o.   MRN: [id number]  HPI  Patient is 44 year old gentleman returns to [facility] for further evaluation and treatment of pain involving the region of the lower back and lower extremity region especially the right lower extremity region. Patient has had improvement of the pressure sensation of the lower back and improvement of some of the weakness and pain radiating to the lower extremity. Patient states that he continues to have difficulty after standing for any significant period of time and walking. Patient states that the pain involves the right lower extremity especially. Patient is undergone follow-up evaluation with neurosurgeon and has been recommended to continue interventional treatment and [facility]. We will proceed with lumbosacral selective nerve root block at time return appointment in attempt to decrease severity of patient's symptoms and minimize progression of symptoms and avoid the need for more involved treatment. The patient was in agreement with suggested treatment plan.    Review of Systems     Objective:   Physical Exam  There was tenderness over the splenius capitis occipitalis musculature regions palpation was reproduced mild discomfort to moderate discomfort. There appeared to be unremarkable Spurling's maneuver. Patient appeared to be with slightly decreased grip strength. Tinel and Phalen's maneuver were without increase of pain of significant degree. Palpation over the region of the thoracic facet region was with tends to palpation without crepitus of the thoracic region noted. There was tenderness over the lumbar paraspinal musculature region lumbar facet region palpation which reproduces moderate discomfort on the right. Palpation over the PSIS and PII S region reproduced moderate discomfort. Straight leg raising was 6 significantly limited on the right and tolerates  approximately 10. There was decreased EHL strength on the right compared to the left. Question decreased sensation of the L5 dermatomal distribution with negative clonus negative Homans. Patient with difficulty attempt and stand on tiptoes and heels especially due to the right lower extremity. Abdomen was without tenderness to palpation and no costovertebral angle tenderness was noted.    Assessment & Plan:  Degenerative disc disease lumbar spine Laminectomy L2 and L3 with decompression of the central canal. 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 Moderate right and mild left foraminal stenosis at L3-4 secondary to lateral disc protrusions and facet hypertrophy. Mild subarticular and mild to moderate foraminal stenosis at L4-5 secondary to acquired and congenital factors worse on the left. Mild disc bulging at L5-S1 without significant stenosis.  Lumbar radiculopathy  Lumbar facet syndrome  Sacroiliac joint dysfunction  Status post surgery of the cervical region  Bilateral occipital neuralgia  Cervical facet syndrome   Plan   Continue present medications. Flexeril and oxycodone  Lumbosacral selective nerve root block to be performed at time of return appointment  F/U PCP for evaliation of  BP and general medical  condition.  F/U surgical evaluation with Dr. Marikay Alaravid [name] as discussed  F/U neurological evaluation  May consider radiofrequency rhizolysis or intraspinal procedures pending response to present treatment and F/U evaluation.  Patient to call [facility] should patient have concerns prior to scheduled return appointment.

## 2015-07-06 NOTE — Patient Instructions (Addendum)
Continue present medications Flexeril and oxycodone  Lumbosacral selective nerve root block to be performed at time of return appointment  F/U PCP for evaliation of  BP and general medical  condition.  F/U surgical evaluation  F/U neurological evaluation  May consider radiofrequency rhizolysis or intraspinal procedures pending response to present treatment and F/U evaluation.  Patient to call [facility] should patient have concerns prior to scheduled return appointment. Flexeril Pre procedure instructions given to patient. Scripts given as ordered.Selective Nerve Root Block Patient Information  Description: Specific nerve roots exit the spinal canal and these nerves can be compressed and inflamed by a bulging disc and bone spurs.  By injecting steroids on the nerve root, we can potentially decrease the inflammation surrounding these nerves, which often leads to decreased pain.  Also, by injecting local anesthesia on the nerve root, this can provide [place] helpful information to give to your referring doctor if it decreases your pain.  Selective nerve root blocks can be done along the spine from the neck to the low back depending on the location of your pain.   After numbing the skin with local anesthesia, a small needle is passed to the nerve root and the position of the needle is verified using x-ray pictures.  After the needle is in correct position, we then deposit the medication.  You may experience a pressure sensation while this is being done.  The entire block usually lasts less than 15 minutes.  Conditions that may be treated with selective nerve root blocks:  Low back and leg pain  Spinal stenosis  Diagnostic block prior to potential surgery  Neck and arm pain  Post laminectomy syndrome  Preparation for the injection:  1. Do not eat any solid food or dairy products within 6 hours of your appointment. 2. You may drink clear liquids up to 2 hours before an  appointment.  Clear liquids include water, black coffee, juice or soda.  No milk or cream please. 3. You may take your regular medications, including pain medications, with a sip of water before your appointment.  Diabetics should hold regular insulin (if taken separately) and take 1/2 normal NPH dose the morning of the procedure.  Carry some sugar containing items with you to your appointment. 4. A driver must accompany you and be prepared to drive you home after your procedure. 5. Bring all your current medications with you. 6. An IV may be inserted and sedation may be given at the discretion of the physician. 7. A blood pressure cuff, EKG, and other monitors will often be applied during the procedure.  Some patients may need to have extra oxygen administered for a short period. 8. You will be asked to provide medical information, including allergies, prior to the procedure.  We must know immediately if you are taking blood  Thinners (like Coumadin) or if you are allergic to IV iodine contrast (dye).  Possible side-effects: All are usually temporary  Bleeding from needle site  Light headedness  Numbness and tingling  Decreased blood pressure  Weakness in arms/legs  Pressure sensation in back/neck  Pain at injection site (several days)  Possible complications: All are extremely rare  Infection  Nerve injury  Spinal headache (a headache wore with upright position)  Call if you experience:  Fever/chills associated with headache or increased back/neck pain  Headache worsened by an upright position  New onset weakness or numbness of an extremity below the injection site  Hives or difficulty breathing (go to  the emergency room)  Inflammation or drainage at the injection site(s)  Severe back/neck pain greater than usual  New symptoms which are concerning to you  Please note:  Although the local anesthetic injected can often make your back or neck feel good for  several hours after the injection the pain will likely return.  It takes 3-5 days for steroids to work on the nerve root. You may not notice any pain relief for at least one week.  If effective, we will often do a series of 3 injections spaced 3-6 weeks apart to maximally decrease your pain.    If you have any questions, please call [phone] [facility]

## 2015-07-06 NOTE — Progress Notes (Signed)
Safety precautions to be maintained throughout the outpatient stay will include: orient to surroundings, keep bed in low position, maintain call bell within reach at all times, provide assistance with transfer out of bed and ambulation.  

## 2015-07-19 ENCOUNTER — Ambulatory Visit: Payer: Medicare Other | Admitting: Pain Medicine

## 2015-07-20 ENCOUNTER — Encounter: Payer: Self-pay | Admitting: Pain Medicine

## 2015-07-20 ENCOUNTER — Ambulatory Visit: Payer: Medicare Other | Attending: Pain Medicine | Admitting: Pain Medicine

## 2015-07-20 VITALS — BP 103/61 | HR 83 | Temp 98.2°F | Resp 16 | Ht 69.0 in | Wt 185.0 lb

## 2015-07-20 DIAGNOSIS — M5030 Other cervical disc degeneration, unspecified cervical region: Secondary | ICD-10-CM

## 2015-07-20 DIAGNOSIS — M48062 Spinal stenosis, lumbar region with neurogenic claudication: Secondary | ICD-10-CM

## 2015-07-20 DIAGNOSIS — M47816 Spondylosis without myelopathy or radiculopathy, lumbar region: Secondary | ICD-10-CM | POA: Insufficient documentation

## 2015-07-20 DIAGNOSIS — M5126 Other intervertebral disc displacement, lumbar region: Secondary | ICD-10-CM | POA: Insufficient documentation

## 2015-07-20 DIAGNOSIS — M79605 Pain in left leg: Secondary | ICD-10-CM | POA: Diagnosis present

## 2015-07-20 DIAGNOSIS — M47812 Spondylosis without myelopathy or radiculopathy, cervical region: Secondary | ICD-10-CM

## 2015-07-20 DIAGNOSIS — M5136 Other intervertebral disc degeneration, lumbar region: Secondary | ICD-10-CM

## 2015-07-20 DIAGNOSIS — M47817 Spondylosis without myelopathy or radiculopathy, lumbosacral region: Secondary | ICD-10-CM

## 2015-07-20 DIAGNOSIS — M79604 Pain in right leg: Secondary | ICD-10-CM | POA: Diagnosis present

## 2015-07-20 DIAGNOSIS — M545 Low back pain: Secondary | ICD-10-CM | POA: Diagnosis present

## 2015-07-20 DIAGNOSIS — M4806 Spinal stenosis, lumbar region: Secondary | ICD-10-CM | POA: Diagnosis not present

## 2015-07-20 MED ORDER — MIDAZOLAM HCL 5 MG/5ML IJ SOLN
INTRAMUSCULAR | Status: AC
  Filled 2015-07-20: qty 5

## 2015-07-20 MED ORDER — CIPROFLOXACIN IN D5W 400 MG/200ML IV SOLN
INTRAVENOUS | Status: AC
  Administered 2015-07-20: 400 mg via INTRAVENOUS
  Filled 2015-07-20: qty 200

## 2015-07-20 MED ORDER — CIPROFLOXACIN HCL 250 MG PO TABS: 250.0000 mg | ORAL_TABLET | Freq: Two times a day (BID) | ORAL | Status: DC

## 2015-07-20 MED ORDER — BUPIVACAINE HCL (PF) 0.25 % IJ SOLN
INTRAMUSCULAR | Status: AC
  Administered 2015-07-20: 30 mL
  Filled 2015-07-20: qty 30

## 2015-07-20 MED ORDER — FENTANYL CITRATE (PF) 100 MCG/2ML IJ SOLN
INTRAMUSCULAR | Status: AC
  Filled 2015-07-20: qty 2

## 2015-07-20 MED ORDER — FENTANYL CITRATE (PF) 100 MCG/2ML IJ SOLN
INTRAMUSCULAR | Status: AC
  Administered 2015-07-20: 100 ug via INTRAVENOUS
  Filled 2015-07-20: qty 2

## 2015-07-20 MED ORDER — ORPHENADRINE CITRATE 30 MG/ML IJ SOLN
INTRAMUSCULAR | Status: DC
Start: 2015-07-20 — End: 2015-07-20
  Filled 2015-07-20: qty 2

## 2015-07-20 MED ORDER — TRIAMCINOLONE ACETONIDE 40 MG/ML IJ SUSP
INTRAMUSCULAR | Status: AC
  Administered 2015-07-20: 40 mg
  Filled 2015-07-20: qty 1

## 2015-07-20 MED ORDER — MIDAZOLAM HCL 5 MG/5ML IJ SOLN
INTRAMUSCULAR | Status: AC
  Administered 2015-07-20: 5 mg via INTRAVENOUS
  Filled 2015-07-20: qty 5

## 2015-07-20 NOTE — Progress Notes (Signed)
Safety precautions to be maintained throughout the outpatient stay will include: orient to surroundings, keep bed in low position, maintain call bell within reach at all times, provide assistance with transfer out of bed and ambulation.  

## 2015-07-20 NOTE — Patient Instructions (Addendum)
Continue present medications Flexeril and oxycodone. Please obtain your antibiotic Cipro today and begin taking antibiotic today  F/U PCP Dr. Juanetta Gosling for evaliation of  BP and general medical  condition.  F/U surgical evaluation as discussed  F/U neurological evaluation.  May consider radiofrequency rhizolysis or intraspinal procedures pending response to present treatment and F/U evaluation.  Patient to call [facility] should patient have concerns prior to scheduled return appointment.  Pain Management Discharge Instructions  General Discharge Instructions :  If you need to reach your doctor call: Monday-Friday 8:00 am - 4:00 pm at [phone] or toll free [phone].  After clinic hours [phone] to have operator reach doctor.  Bring all of your medication bottles to all your appointments in the pain clinic.  To cancel or reschedule your appointment with Pain Management please remember to call 24 hours in advance to avoid a fee.  Refer to the educational materials which you have been given on: General Risks, I had my Procedure. Discharge Instructions, Post Sedation.  Post Procedure Instructions:  The drugs you were given will stay in your system until tomorrow, so for the next 24 hours you should not drive, make any legal decisions or drink any alcoholic beverages.  You may eat anything you prefer, but it is better to start with liquids then soups and crackers, and gradually work up to solid foods.  Please notify your doctor immediately if you have any unusual bleeding, trouble breathing or pain that is not related to your normal pain.  Depending on the type of procedure that was done, some parts of your body may feel week and/or numb.  This usually clears up by tonight or the next day.  Walk with the use of an assistive device or accompanied by an adult for the 24 hours.  You may use ice on the affected area for the first 24 hours.  Put ice in a Ziploc bag and  cover with a towel and place against area 15 minutes on 15 minutes off.  You may switch to heat after 24 hours.Selective Nerve Root Block Patient Information  Description: Specific nerve roots exit the spinal canal and these nerves can be compressed and inflamed by a bulging disc and bone spurs.  By injecting steroids on the nerve root, we can potentially decrease the inflammation surrounding these nerves, which often leads to decreased pain.  Also, by injecting local anesthesia on the nerve root, this can provide [place] helpful information to give to your referring doctor if it decreases your pain.  Selective nerve root blocks can be done along the spine from the neck to the low back depending on the location of your pain.   After numbing the skin with local anesthesia, a small needle is passed to the nerve root and the position of the needle is verified using x-ray pictures.  After the needle is in correct position, we then deposit the medication.  You may experience a pressure sensation while this is being done.  The entire block usually lasts less than 15 minutes.  Conditions that may be treated with selective nerve root blocks:  Low back and leg pain  Spinal stenosis  Diagnostic block prior to potential surgery  Neck and arm pain  Post laminectomy syndrome  Preparation for the injection:  1. Do not eat any solid food or dairy products within 6 hours of your appointment. 2. You may drink clear liquids up to 2 hours before an appointment.  Clear liquids include water, black  coffee, juice or soda.  No milk or cream please. 3. You may take your regular medications, including pain medications, with a sip of water before your appointment.  Diabetics should hold regular insulin (if taken separately) and take 1/2 normal NPH dose the morning of the procedure.  Carry some sugar containing items with you to your appointment. 4. A driver must accompany you and be prepared to drive you home after your  procedure. 5. Bring all your current medications with you. 6. An IV may be inserted and sedation may be given at the discretion of the physician. 7. A blood pressure cuff, EKG, and other monitors will often be applied during the procedure.  Some patients may need to have extra oxygen administered for a short period. 8. You will be asked to provide medical information, including allergies, prior to the procedure.  We must know immediately if you are taking blood  Thinners (like Coumadin) or if you are allergic to IV iodine contrast (dye).  Possible side-effects: All are usually temporary  Bleeding from needle site  Light headedness  Numbness and tingling  Decreased blood pressure  Weakness in arms/legs  Pressure sensation in back/neck  Pain at injection site (several days)  Possible complications: All are extremely rare  Infection  Nerve injury  Spinal headache (a headache wore with upright position)  Call if you experience:  Fever/chills associated with headache or increased back/neck pain  Headache worsened by an upright position  New onset weakness or numbness of an extremity below the injection site  Hives or difficulty breathing (go to the emergency room)  Inflammation or drainage at the injection site(s)  Severe back/neck pain greater than usual  New symptoms which are concerning to you  Please note:  Although the local anesthetic injected can often make your back or neck feel good for several hours after the injection the pain will likely return.  It takes 3-5 days for steroids to work on the nerve root. You may not notice any pain relief for at least one week.  If effective, we will often do a series of 3 injections spaced 3-6 weeks apart to maximally decrease your pain.    If you have any questions, please call [phone] [facility] Dis[facility] Inst[facility]  General Discharge Instructions :  If you  need to reach your doctor call: Monday-Friday 8:00 am - 4:00 pm at [phone] or toll free [phone].  After clinic hours [phone] to have operator reach doctor.  Bring all of your medication bottles to all your appointments in the pain clinic.  To cancel or reschedule your appointment with Pain Management please remember to call 24 hours in advance to avoid a fee.  Refer to the educational materials which you have been given on: General Risks, I had my Procedure. Discharge Instructions, Post Sedation.  Post Procedure Instructions:  The drugs you were given will stay in your system until tomorrow, so for the next 24 hours you should not drive, make any legal decisions or drink any alcoholic beverages.  You may eat anything you prefer, but it is better to start with liquids then soups and crackers, and gradually work up to solid foods.  Please notify your doctor immediately if you have any unusual bleeding, trouble breathing or pain that is not related to your normal pain.  Depending on the type of procedure that was done, some parts of your body may feel week and/or numb.  This usually clears up by  tonight or the next day.  Walk with the use of an assistive device or accompanied by an adult for the 24 hours.  You may use ice on the affected area for the first 24 hours.  Put ice in a Ziploc bag and cover with a towel and place against area 15 minutes on 15 minutes off.  You may switch to heat after 24 hours.Selective Nerve Root Block Patient Information  Description: Specific nerve roots exit the spinal canal and these nerves can be compressed and inflamed by a bulging disc and bone spurs.  By injecting steroids on the nerve root, we can potentially decrease the inflammation surrounding these nerves, which often leads to decreased pain.  Also, by injecting local anesthesia on the nerve root, this can provide [place] helpful information to give to your referring doctor if it decreases your  pain.  Selective nerve root blocks can be done along the spine from the neck to the low back depending on the location of your pain.   After numbing the skin with local anesthesia, a small needle is passed to the nerve root and the position of the needle is verified using x-ray pictures.  After the needle is in correct position, we then deposit the medication.  You may experience a pressure sensation while this is being done.  The entire block usually lasts less than 15 minutes.  Conditions that may be treated with selective nerve root blocks:  Low back and leg pain  Spinal stenosis  Diagnostic block prior to potential surgery  Neck and arm pain  Post laminectomy syndrome  Preparation for the injection:  9. Do not eat any solid food or dairy products within 6 hours of your appointment. 10. You may drink clear liquids up to 2 hours before an appointment.  Clear liquids include water, black coffee, juice or soda.  No milk or cream please. 11. You may take your regular medications, including pain medications, with a sip of water before your appointment.  Diabetics should hold regular insulin (if taken separately) and take 1/2 normal NPH dose the morning of the procedure.  Carry some sugar containing items with you to your appointment. 12. A driver must accompany you and be prepared to drive you home after your procedure. 13. Bring all your current medications with you. 14. An IV may be inserted and sedation may be given at the discretion of the physician. 15. A blood pressure cuff, EKG, and other monitors will often be applied during the procedure.  Some patients may need to have extra oxygen administered for a short period. 16. You will be asked to provide medical information, including allergies, prior to the procedure.  We must know immediately if you are taking blood  Thinners (like Coumadin) or if you are allergic to IV iodine contrast (dye).  Possible side-effects: All are usually  temporary  Bleeding from needle site  Light headedness  Numbness and tingling  Decreased blood pressure  Weakness in arms/legs  Pressure sensation in back/neck  Pain at injection site (several days)  Possible complications: All are extremely rare  Infection  Nerve injury  Spinal headache (a headache wore with upright position)  Call if you experience:  Fever/chills associated with headache or increased back/neck pain  Headache worsened by an upright position  New onset weakness or numbness of an extremity below the injection site  Hives or difficulty breathing (go to the emergency room)  Inflammation or drainage at the injection site(s)  Severe back/neck pain  greater than usual  New symptoms which are concerning to you  Please note:  Although the local anesthetic injected can often make your back or neck feel good for several hours after the injection the pain will likely return.  It takes 3-5 days for steroids to work on the nerve root. You may not notice any pain relief for at least one week.  If effective, we will often do a series of 3 injections spaced 3-6 weeks apart to maximally decrease your pain.    If you have any questions, please call [phone] [facility]

## 2015-07-20 NOTE — Progress Notes (Signed)
Subjective:    Patient ID: Mark Snow, male    DOB: 12-31-1970, 44 y.o.   MRN: [id number]  HPI   PROCEDURE PERFORMED: Lumbosacral selective nerve root block   NOTE: The patient is a 44 y.o. male who returns to [facility] for further evaluation and treatment of pain involving the lumbar and lower extremity region. Studies consisting of MRI has revealed the patient to be with evidence of degenerative changes lumbar spine status post lumbar laminectomy L2-3 with decompression of the central canal moderate right and left foraminal stenosis at L3-4 secondary to lateral disc protrusions and facet hypertrophy with moderate foraminal stenosis at L4-L5 secondary to acquired and congenital fact is worse on the left with mild bulging at L5-S1. There is concern regarding intraspinal abnormalities contributing to the patient's symptomatology. The risks, benefits, and expectations of the procedure have been explained to the patient who was understanding and in agreement with suggested treatment plan. We will proceed with interventional treatment as discussed and as explained to the patient. The patient is understanding and in agreement with suggested treatment plan.   DESCRIPTION OF PROCEDURE: Lumbosacral selective nerve root block with IV Versed, IV fentanyl conscious sedation, EKG, blood pressure, pulse, and pulse oximetry monitoring. The procedure was performed with the patient in the prone position under fluoroscopic guidance. With the patient in the prone position, Betadine prep of proposed entry site was performed. Local anesthetic skin wheal of proposed needle entry site was prepared with 1.5% plain lidocaine with AP view of the lumbosacral spine.   PROCEDURE #1: Needle placement at the L 3 vertebral body: A 22 -gauge needle was inserted at the inferior border of the transverse process of the vertebral body with needle placed medial to the midline of the transverse process on AP view of  the lumbosacral spine.   NEEDLE PLACEMENT AT  L4 and L5 vertebral body levels   Needle  placement was accomplished at L4 and L5 vertebral body levels  on the right side exactly as was accomplished at the L3 vertebral body level level utilizing the same technique and under fluoroscopic guidance.  PROCEDURE #4: Needle placement at the S1 foramen. With the patient in the prone position with Betadine prep of proposed entry site accomplished, the S1 foramen was visualized under fluoroscopic guidance with AP view of the lumbosacral spine with cephalad orientation of the fluoroscope with local anesthetic skin wheal of 1.5% lidocaine of proposed needle entry site prepared. A 22-gauge needle was inserted S1 foramen under fluoroscopic guidance eliciting paresthesias radiating from the buttocks to the lower extremity after which needle was slightly withdrawn.   Needle placement was then verified on lateral view at all levels with needle tip documented to be in the posterior superior quadrant of the intervertebral foramen of  L 3, L4, and L5, and needle tip documented at the level of the S1 foramen. Following negative aspiration for heme and CSF at each level, each level was injected with 3 mL of 0.25% bupivacaine with Kenalog.     The patient tolerated the procedure well. A total of 10 mg of Kenalog was utilized for the procedure.   PLAN:  1. Medications: Will continue presently prescribed medications Flexeril and oxycodone. 2. The patient is to undergo follow-up evaluation with PCP Dr. Juanetta Gosling for evaluation of blood pressure and general medical condition status post procedure performed on today's visit. 3. Surgical follow-up evaluation. Patient will follow-up with Dr. Marikay Alar in this regard 4. Neurological evaluation. 5. May  consider radiofrequency procedures, implantation type procedures and other treatment pending response to treatment and follow-up evaluation. 6. The patient has been advise do  adhere to proper body mechanics and avoid activities which may aggravate condition. 7. The patient has been advised to call the [facility] Management Center prior to scheduled return appointment should there be significant change in the patient's condition or should the patient have other concerns regarding condition prior to scheduled return appointment.     Review of Systems     Objective:   Physical Exam        Assessment & Plan:

## 2015-07-21 ENCOUNTER — Telehealth: Payer: Self-pay | Admitting: *Deleted

## 2015-07-21 ENCOUNTER — Other Ambulatory Visit: Payer: Self-pay | Admitting: Pain Medicine

## 2015-07-21 NOTE — Telephone Encounter (Signed)
DENIES COMPLICATIONS POST PROCEDURE 

## 2015-08-04 ENCOUNTER — Ambulatory Visit: Payer: Medicare Other | Attending: Pain Medicine | Admitting: Pain Medicine

## 2015-08-04 ENCOUNTER — Encounter: Payer: Self-pay | Admitting: Pain Medicine

## 2015-08-04 VITALS — BP 111/75 | HR 90 | Temp 98.2°F | Resp 16 | Ht 69.0 in | Wt 185.0 lb

## 2015-08-04 DIAGNOSIS — M48062 Spinal stenosis, lumbar region with neurogenic claudication: Secondary | ICD-10-CM

## 2015-08-04 DIAGNOSIS — M48061 Spinal stenosis, lumbar region without neurogenic claudication: Secondary | ICD-10-CM

## 2015-08-04 DIAGNOSIS — M5136 Other intervertebral disc degeneration, lumbar region: Secondary | ICD-10-CM | POA: Diagnosis not present

## 2015-08-04 DIAGNOSIS — M5126 Other intervertebral disc displacement, lumbar region: Secondary | ICD-10-CM | POA: Diagnosis not present

## 2015-08-04 DIAGNOSIS — M5030 Other cervical disc degeneration, unspecified cervical region: Secondary | ICD-10-CM

## 2015-08-04 DIAGNOSIS — M533 Sacrococcygeal disorders, not elsewhere classified: Secondary | ICD-10-CM | POA: Diagnosis not present

## 2015-08-04 DIAGNOSIS — M5481 Occipital neuralgia: Secondary | ICD-10-CM | POA: Diagnosis not present

## 2015-08-04 DIAGNOSIS — M79604 Pain in right leg: Secondary | ICD-10-CM | POA: Diagnosis present

## 2015-08-04 DIAGNOSIS — M545 Low back pain: Secondary | ICD-10-CM | POA: Diagnosis present

## 2015-08-04 DIAGNOSIS — Z9889 Other specified postprocedural states: Secondary | ICD-10-CM | POA: Diagnosis not present

## 2015-08-04 DIAGNOSIS — M47817 Spondylosis without myelopathy or radiculopathy, lumbosacral region: Secondary | ICD-10-CM

## 2015-08-04 DIAGNOSIS — M5416 Radiculopathy, lumbar region: Secondary | ICD-10-CM | POA: Diagnosis not present

## 2015-08-04 DIAGNOSIS — M4806 Spinal stenosis, lumbar region: Secondary | ICD-10-CM | POA: Insufficient documentation

## 2015-08-04 DIAGNOSIS — M47812 Spondylosis without myelopathy or radiculopathy, cervical region: Secondary | ICD-10-CM

## 2015-08-04 DIAGNOSIS — M79605 Pain in left leg: Secondary | ICD-10-CM | POA: Diagnosis present

## 2015-08-04 MED ORDER — CYCLOBENZAPRINE HCL 10 MG PO TABS: ORAL_TABLET | ORAL | Status: DC

## 2015-08-04 MED ORDER — OXYCODONE HCL 10 MG PO TABS: ORAL_TABLET | ORAL | Status: DC

## 2015-08-04 NOTE — Progress Notes (Signed)
   Subjective:    Patient ID: Mark Snow, male    DOB: 11/01/1971, 44 y.o.   MRN: [id number]  HPI  Patient 44 year old gentleman returns to [facility] for further evaluation and treatment of pain involving the lower back and lower extremity region. States that his pain involves the right lower extremity region predominantly. Patient states that pain involving the neck and upper extremity regions appears to be fairly well-controlled at this time. Patient states that pain is increased with walking standing and that pain becomes more intense as the day progresses. Patient has undergone evaluation by Dr. Marikay Alar neurosurgery was recommended patient continue treatment and [facility] at this time. This patient's condition and will proceed with lumbar epidural steroid injection to be performed at time return appointment in attempt to decrease severity of patient's symptoms, minimize progression of symptoms, and avoid the need for more involved treatment. The patient was with understanding and in agreement status treatment plan.    Review of Systems     Objective:   Physical Exam  There was tenderness of the splenius capitis and occipitalis musculature region of mild to moderate degree. There appeared to be mild to moderate tenderness of the cervical facet cervical paraspinal musculature as well as the thoracic facet thoracic paraspinal musculature. There was unremarkable Spurling's maneuver. Patient appeared to be slightly decreased grip strength. Tinel and Phalen's maneuver were without increase of pain of significant degree. Palpation over the thoracic facet thoracic paraspinal muscles reproduced pain of moderate degree. No crepitus of the thoracic region was noted. Palpation over the lumbar paraspinal muscle lumbar facet region associated with moderate moderate severe discomfort. Lateral bending and rotation and extension and palpation of the lumbar facets reproduce  moderate to moderately severe discomfort. Straight leg raising tolerates approximately 20 without increased pain with dorsiflexion noted. There was negative clonus negative Homans. DTRs were difficult to elicit. Patient had difficulty attempt to stand on tiptoes and heels. He decreased sensation of the L5 dermatomal distribution. There was no abdominal tends to palpation no costovertebral angle tenderness noted.      Assessment & Plan:    Degenerative disc disease lumbar spine Laminectomy L2 and L3 with decompression of the central canal. 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 Moderate right and mild left foraminal stenosis at L3-4 secondary to lateral disc protrusions and facet hypertrophy. Mild subarticular and mild to moderate foraminal stenosis at L4-5 secondary to acquired and congenital factors worse on the left. Mild disc bulging at L5-S1 without significant stenosis.  Lumbar radiculopathy  Lumbar facet syndrome  Sacroiliac joint dysfunction  Status post surgery of the cervical region  Bilateral occipital neuralgia    Plan   Continue present medications Flexeril and oxycodone  Lumbar epidural steroid injection to be performed at time return appointment  F/U PCP Dr. Juanetta Gosling for evaliation of  BP and general medical  condition  F/U surgical evaluation Dr. Marikay Alar  F/U neurological evaluation with [facility] EMG studies to be considered as discussed May consider radiofrequency rhizolysis or intraspinal procedures pending response to present treatment and F/U evaluation   Patient to call Pain Management Center should patient have concerns prior to scheduled return appointmen.

## 2015-08-04 NOTE — Progress Notes (Signed)
Safety precautions to be maintained throughout the outpatient stay will include: orient to surroundings, keep bed in low position, maintain call bell within reach at all times, provide assistance with transfer out of bed and ambulation.  

## 2015-08-04 NOTE — Patient Instructions (Addendum)
Continue present medications Flexeril and oxycodone as discussed  Lumbar epidural steroid injection to be performed at time of return appointment  F/U PCP Dr. Juanetta Gosling for evaliation of  BP and general medical  condition  F/U surgical evaluation. Patient will follow-up with Dr. Marikay Alar as discussed  F/U neurological evaluation  May consider radiofrequency rhizolysis or intraspinal procedures pending response to present treatment and F/U evaluation   Patient to call [facility] Management Center should patient have concerns prior to scheduled return appointmen. Epidural Steroid Injection Patient Information  Description: The epidural space surrounds the nerves as they exit the spinal cord.  In some patients, the nerves can be compressed and inflamed by a bulging disc or a tight spinal canal (spinal stenosis).  By injecting steroids into the epidural space, we can bring irritated nerves into direct contact with a potentially helpful medication.  These steroids act directly on the irritated nerves and can reduce swelling and inflammation which often leads to decreased pain.  Epidural steroids may be injected anywhere along the spine and from the neck to the low back depending upon the location of your pain.   After numbing the skin with local anesthetic (like Novocaine), a small needle is passed into the epidural space slowly.  You may experience a sensation of pressure while this is being done.  The entire block usually last less than 10 minutes.  Conditions which may be treated by epidural steroids:   Low back and leg pain  Neck and arm pain  Spinal stenosis  Post-laminectomy syndrome  Herpes zoster (shingles) pain  Pain from compression fractures  Preparation for the injection:  1. Do not eat any solid food or dairy products within 6 hours of your appointment.  2. You may drink clear liquids up to 2 hours before appointment.  Clear liquids include water, black coffee, juice or soda.   No milk or cream please. 3. You may take your regular medication, including pain medications, with a sip of water before your appointment  Diabetics should hold regular insulin (if taken separately) and take 1/2 normal NPH dos the morning of the procedure.  Carry some sugar containing items with you to your appointment. 4. A driver must accompany you and be prepared to drive you home after your procedure.  5. Bring all your current medications with your. 6. An IV may be inserted and sedation may be given at the discretion of the physician.   7. A blood pressure cuff, EKG and other monitors will often be applied during the procedure.  Some patients may need to have extra oxygen administered for a short period. 8. You will be asked to provide medical information, including your allergies, prior to the procedure.  We must know immediately if you are taking blood thinners (like Coumadin/Warfarin)  Or if you are allergic to IV iodine contrast (dye). We must know if you could possible be pregnant.  Possible side-effects:  Bleeding from needle site  Infection (rare, may require surgery)  Nerve injury (rare)  Numbness & tingling (temporary)  Difficulty urinating (rare, temporary)  Spinal headache ( a headache worse with upright posture)  Light -headedness (temporary)  Pain at injection site (several days)  Decreased blood pressure (temporary)  Weakness in arm/leg (temporary)  Pressure sensation in back/neck (temporary)  Call if you experience:  Fever/chills associated with headache or increased back/neck pain.  Headache worsened by an upright position.  New onset weakness or numbness of an extremity below the injection site  Hives  or difficulty breathing (go to the emergency room)  Inflammation or drainage at the infection site  Severe back/neck pain  Any new symptoms which are concerning to you  Please note:  Although the local anesthetic injected can often make your back  or neck feel good for several hours after the injection, the pain will likely return.  It takes 3-7 days for steroids to work in the epidural space.  You may not notice any pain relief for at least that one week.  If effective, we will often do a series of three injections spaced 3-6 weeks apart to maximally decrease your pain.  After the initial series, we generally will wait several months before considering a repeat injection of the same type.  If you have any questions, please call [phone] [facility]

## 2015-08-08 ENCOUNTER — Ambulatory Visit: Payer: Medicare Other | Attending: Pain Medicine | Admitting: Pain Medicine

## 2015-08-08 ENCOUNTER — Encounter: Payer: Self-pay | Admitting: Pain Medicine

## 2015-08-08 VITALS — BP 106/71 | HR 87 | Temp 98.1°F | Resp 16 | Ht 69.0 in | Wt 185.0 lb

## 2015-08-08 DIAGNOSIS — M48062 Spinal stenosis, lumbar region with neurogenic claudication: Secondary | ICD-10-CM

## 2015-08-08 DIAGNOSIS — M5030 Other cervical disc degeneration, unspecified cervical region: Secondary | ICD-10-CM

## 2015-08-08 DIAGNOSIS — M5136 Other intervertebral disc degeneration, lumbar region: Secondary | ICD-10-CM | POA: Insufficient documentation

## 2015-08-08 DIAGNOSIS — M5126 Other intervertebral disc displacement, lumbar region: Secondary | ICD-10-CM | POA: Diagnosis not present

## 2015-08-08 DIAGNOSIS — M79604 Pain in right leg: Secondary | ICD-10-CM | POA: Diagnosis present

## 2015-08-08 DIAGNOSIS — M545 Low back pain: Secondary | ICD-10-CM | POA: Diagnosis present

## 2015-08-08 DIAGNOSIS — M4806 Spinal stenosis, lumbar region: Secondary | ICD-10-CM | POA: Diagnosis not present

## 2015-08-08 DIAGNOSIS — M79605 Pain in left leg: Secondary | ICD-10-CM | POA: Diagnosis present

## 2015-08-08 DIAGNOSIS — M47812 Spondylosis without myelopathy or radiculopathy, cervical region: Secondary | ICD-10-CM

## 2015-08-08 DIAGNOSIS — M47817 Spondylosis without myelopathy or radiculopathy, lumbosacral region: Secondary | ICD-10-CM

## 2015-08-08 MED ORDER — FENTANYL CITRATE (PF) 100 MCG/2ML IJ SOLN
INTRAMUSCULAR | Status: AC
  Administered 2015-08-08: 100 ug via INTRAVENOUS
  Filled 2015-08-08: qty 2

## 2015-08-08 MED ORDER — ORPHENADRINE CITRATE 30 MG/ML IJ SOLN
INTRAMUSCULAR | Status: AC
  Administered 2015-08-08: 60 mg
  Filled 2015-08-08: qty 2

## 2015-08-08 MED ORDER — BUPIVACAINE HCL (PF) 0.25 % IJ SOLN
INTRAMUSCULAR | Status: AC
  Administered 2015-08-08: 10 mL
  Filled 2015-08-08: qty 30

## 2015-08-08 MED ORDER — MIDAZOLAM HCL 5 MG/5ML IJ SOLN
INTRAMUSCULAR | Status: AC
  Administered 2015-08-08: 12:00:00 via INTRAVENOUS
  Filled 2015-08-08: qty 5

## 2015-08-08 MED ORDER — TRIAMCINOLONE ACETONIDE 40 MG/ML IJ SUSP
INTRAMUSCULAR | Status: AC
  Administered 2015-08-08: 40 mg
  Filled 2015-08-08: qty 1

## 2015-08-08 MED ORDER — CIPROFLOXACIN HCL 250 MG PO TABS: 250.0000 mg | ORAL_TABLET | Freq: Two times a day (BID) | ORAL | Status: DC

## 2015-08-08 MED ORDER — SODIUM CHLORIDE 0.9 % IJ SOLN
INTRAMUSCULAR | Status: AC
  Administered 2015-08-08: 4 mL
  Filled 2015-08-08: qty 10

## 2015-08-08 MED ORDER — LIDOCAINE HCL (PF) 1 % IJ SOLN
INTRAMUSCULAR | Status: AC
  Administered 2015-08-08: 5 mL
  Filled 2015-08-08: qty 5

## 2015-08-08 MED ORDER — CIPROFLOXACIN IN D5W 400 MG/200ML IV SOLN
INTRAVENOUS | Status: AC
  Administered 2015-08-08: 400 mg via INTRAVENOUS
  Filled 2015-08-08: qty 200

## 2015-08-08 NOTE — Progress Notes (Signed)
Safety precautions to be maintained throughout the outpatient stay will include: orient to surroundings, keep bed in low position, maintain call bell within reach at all times, provide assistance with transfer out of bed and ambulation.  

## 2015-08-08 NOTE — Patient Instructions (Addendum)
Continue present medications and begin taking antibiotic Cipro as prescribed. Please obtain your antibiotic today and begin taking antibiotic today  F/U PCP Dr. Juanetta Gosling for evaliation of  BP and general medical  condition.  F/U surgical evaluation as discussed  F/U neurological evaluation.  May consider radiofrequency rhizolysis or intraspinal procedures pending response to present treatment and F/U evaluation.  Patient to call [facility] should patient have concerns prior to scheduled return appointment.  Pain Management Discharge Instructions  General Discharge Instructions :  If you need to reach your doctor call: Monday-Friday 8:00 am - 4:00 pm at [phone] or toll free [phone].  After clinic hours [phone] to have operator reach doctor.  Bring all of your medication bottles to all your appointments in the pain clinic.  To cancel or reschedule your appointment with Pain Management please remember to call 24 hours in advance to avoid a fee.  Refer to the educational materials which you have been given on: General Risks, I had my Procedure. Discharge Instructions, Post Sedation.  Post Procedure Instructions:  The drugs you were given will stay in your system until tomorrow, so for the next 24 hours you should not drive, make any legal decisions or drink any alcoholic beverages.  You may eat anything you prefer, but it is better to start with liquids then soups and crackers, and gradually work up to solid foods.  Please notify your doctor immediately if you have any unusual bleeding, trouble breathing or pain that is not related to your normal pain.  Depending on the type of procedure that was done, some parts of your body may feel week and/or numb.  This usually clears up by tonight or the next day.  Walk with the use of an assistive device or accompanied by an adult for the 24 hours.  You may use ice on the affected area for the first 24 hours.  Put ice  in a Ziploc bag and cover with a towel and place against area 15 minutes on 15 minutes off.  You may switch to heat after 24 hours.GENERAL RISKS AND COMPLICATIONS  What are the risk, side effects and possible complications? Generally speaking, most procedures are safe.  However, with any procedure there are risks, side effects, and the possibility of complications.  The risks and complications are dependent upon the sites that are lesioned, or the type of nerve block to be performed.  The closer the procedure is to the spine, the more serious the risks are.  Great care is taken when placing the radio frequency needles, block needles or lesioning probes, but sometimes complications can occur. 1. Infection: Any time there is an injection through the skin, there is a risk of infection.  This is why sterile conditions are used for these blocks.  There are four possible types of infection. 1. Localized skin infection. 2. Central Nervous System Infection-This can be in the form of Meningitis, which can be deadly. 3. Epidural Infections-This can be in the form of an epidural abscess, which can cause pressure inside of the spine, causing compression of the spinal cord with subsequent paralysis. This would require an emergency surgery to decompress, and there are no guarantees that the patient would recover from the paralysis. 4. Discitis-This is an infection of the intervertebral discs.  It occurs in about 1% of discography procedures.  It is difficult to treat and it may lead to surgery.        2. Pain: the needles have to go  through skin and soft tissues, will cause soreness.       3. Damage to internal structures:  The nerves to be lesioned may be near blood vessels or    other nerves which can be potentially damaged.       4. Bleeding: Bleeding is more common if the patient is taking blood thinners such as  aspirin, Coumadin, Ticiid, Plavix, etc., or if he/she have some genetic predisposition  such as  hemophilia. Bleeding into the spinal canal can cause compression of the spinal  cord with subsequent paralysis.  This would require an emergency surgery to  decompress and there are no guarantees that the patient would recover from the  paralysis.       5. Pneumothorax:  Puncturing of a lung is a possibility, every time a needle is introduced in  the area of the chest or upper back.  Pneumothorax refers to free air around the  collapsed lung(s), inside of the thoracic cavity (chest cavity).  Another two possible  complications related to a similar event would include: Hemothorax and Chylothorax.   These are variations of the Pneumothorax, where instead of air around the collapsed  lung(s), you may have blood or chyle, respectively.       6. Spinal headaches: They may occur with any procedures in the area of the spine.       7. Persistent CSF (Cerebro-Spinal Fluid) leakage: This is a rare problem, but may occur  with prolonged intrathecal or epidural catheters either due to the formation of a fistulous  track or a dural tear.       8. Nerve damage: By working so close to the spinal cord, there is always a possibility of  nerve damage, which could be as serious as a permanent spinal cord injury with  paralysis.       9. Death:  Although rare, severe deadly allergic reactions known as "Anaphylactic  reaction" can occur to any of the medications used.      10. Worsening of the symptoms:  We can always make thing worse.  What are the chances of something like this happening? Chances of any of this occuring are extremely low.  By statistics, you have more of a chance of getting killed in a motor vehicle accident: while driving to the hospital than any of the above occurring .  Nevertheless, you should be aware that they are possibilities.  In general, it is similar to taking a shower.  Everybody knows that you can slip, hit your head and get killed.  Does that mean that you should not shower again?  Nevertheless  always keep in mind that statistics do not mean anything if you happen to be on the wrong side of them.  Even if a procedure has a 1 (one) in a 1,000,000 (million) chance of going wrong, it you happen to be that one..Also, keep in mind that by statistics, you have more of a chance of having something go wrong when taking medications.  Who should not have this procedure? If you are on a blood thinning medication (e.g. Coumadin, Plavix, see list of "Blood Thinners"), or if you have an active infection going on, you should not have the procedure.  If you are taking any blood thinners, please inform your physician.  How should I prepare for this procedure?  Do not eat or drink anything at least six hours prior to the procedure.  Bring a driver with you .  It cannot be  a taxi.  Come accompanied by an adult that can drive you back, and that is strong enough to help you if your legs get weak or numb from the local anesthetic.  Take all of your medicines the morning of the procedure with just enough water to swallow them.  If you have diabetes, make sure that you are scheduled to have your procedure done first thing in the morning, whenever possible.  If you have diabetes, take only half of your insulin dose and notify our nurse that you have done so as soon as you arrive at the clinic.  If you are diabetic, but only take blood sugar pills (oral hypoglycemic), then do not take them on the morning of your procedure.  You may take them after you have had the procedure.  Do not take aspirin or any aspirin-containing medications, at least eleven (11) days prior to the procedure.  They may prolong bleeding.  Wear loose fitting clothing that may be easy to take off and that you would not mind if it got stained with Betadine or blood.  Do not wear any jewelry or perfume  Remove any nail coloring.  It will interfere with some of our monitoring equipment.  NOTE: Remember that this is not meant to be  interpreted as a complete list of all possible complications.  Unforeseen problems may occur.  BLOOD THINNERS The following drugs contain aspirin or other products, which can cause increased bleeding during surgery and should not be taken for 2 weeks prior to and 1 week after surgery.  If you should need take something for relief of minor pain, you may take acetaminophen which is found in Tylenol,m Datril, Anacin-3 and Panadol. It is not blood thinner. The products listed below are.  Do not take any of the products listed below in addition to any listed on your instruction sheet.  A.P.C or A.P.C with Codeine Codeine Phosphate Capsules #3 Ibuprofen Ridaura  ABC compound Congesprin Imuran rimadil  Advil Cope Indocin Robaxisal  Alka-Seltzer Effervescent Pain Reliever and Antacid Coricidin or Coricidin-D  Indomethacin Rufen  Alka-Seltzer plus Cold Medicine Cosprin Ketoprofen S-A-C Tablets  Anacin Analgesic Tablets or Capsules Coumadin Korlgesic Salflex  Anacin Extra Strength Analgesic tablets or capsules CP-2 Tablets Lanoril Salicylate  Anaprox Cuprimine Capsules Levenox Salocol  Anexsia-D Dalteparin Magan Salsalate  Anodynos Darvon compound Magnesium Salicylate Sine-off  Ansaid Dasin Capsules Magsal Sodium Salicylate  Anturane Depen Capsules Marnal Soma  APF Arthritis pain formula Dewitt's Pills Measurin Stanback  Argesic Dia-Gesic Meclofenamic Sulfinpyrazone  Arthritis Bayer Timed Release Aspirin Diclofenac Meclomen Sulindac  Arthritis pain formula Anacin Dicumarol Medipren Supac  Analgesic (Safety coated) Arthralgen Diffunasal Mefanamic Suprofen  Arthritis Strength Bufferin Dihydrocodeine Mepro Compound Suprol  Arthropan liquid Dopirydamole Methcarbomol with Aspirin Synalgos  ASA tablets/Enseals Disalcid Micrainin Tagament  Ascriptin Doan's Midol Talwin  Ascriptin A/D Dolene Mobidin Tanderil  Ascriptin Extra Strength Dolobid Moblgesic Ticlid  Ascriptin with Codeine Doloprin or Doloprin  with Codeine Momentum Tolectin  Asperbuf Duoprin Mono-gesic Trendar  Aspergum Duradyne Motrin or Motrin IB Triminicin  Aspirin plain, buffered or enteric coated Durasal Myochrisine Trigesic  Aspirin Suppositories Easprin Nalfon Trillsate  Aspirin with Codeine Ecotrin Regular or Extra Strength Naprosyn Uracel  Atromid-S Efficin Naproxen Ursinus  Auranofin Capsules Elmiron Neocylate Vanquish  Axotal Emagrin Norgesic Verin  Azathioprine Empirin or Empirin with Codeine Normiflo Vitamin E  Azolid Emprazil Nuprin Voltaren  Bayer Aspirin plain, buffered or children's or timed BC Tablets or powders Encaprin Orgaran Warfarin Sodium  Buff-a-Comp Enoxaparin  Orudis Zorpin  Buff-a-Comp with Codeine Equegesic Os-Cal-Gesic   Buffaprin Excedrin plain, buffered or Extra Strength Oxalid   Bufferin Arthritis Strength Feldene Oxphenbutazone   Bufferin plain or Extra Strength Feldene Capsules Oxycodone with Aspirin   Bufferin with Codeine Fenoprofen Fenoprofen Pabalate or Pabalate-SF   Buffets II Flogesic Panagesic   Buffinol plain or Extra Strength Florinal or Florinal with Codeine Panwarfarin   Buf-Tabs Flurbiprofen Penicillamine   Butalbital Compound Four-way cold tablets Penicillin   Butazolidin Fragmin Pepto-Bismol   Carbenicillin Geminisyn Percodan   Carna Arthritis Reliever Geopen Persantine   Carprofen Gold's salt Persistin   Chloramphenicol Goody's Phenylbutazone   Chloromycetin Haltrain Piroxlcam   Clmetidine heparin Plaquenil   Cllnoril Hyco-pap Ponstel   Clofibrate Hydroxy chloroquine Propoxyphen         Before stopping any of these medications, be sure to consult the physician who ordered them.  Some, such as Coumadin (Warfarin) are ordered to prevent or treat serious conditions such as "deep thrombosis", "pumonary embolisms", and other heart problems.  The amount of time that you may need off of the medication may also vary with the medication and the reason for which you were taking it.   If you are taking any of these medications, please make sure you notify your pain physician before you undergo any procedures.  Pick up Cipro at pharmacy.

## 2015-08-08 NOTE — Progress Notes (Signed)
   Subjective:    Patient ID: COLESON KANT, male    DOB: [birth year], 44 y.o.   MRN: [id number]  HPI  PROCEDURE PERFORMED: Lumbar epidural steroid injection   NOTE: The patient is a 44 y.o. male who returns to [facility] for further evaluation and treatment of pain involving the lumbar and lower extremity region.  MRI revealed the patient to be with degenerative disc disease lumbar spine  withLaminectomy L2 and L3 with decompression of the central canal. 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 Moderate right and mild left foraminal stenosis at L3-4 secondary to lateral disc protrusions and facet hypertrophy. Mild subarticular and mild to moderate foraminal stenosis at L4-5 secondary to acquired and congenital factors worse on the left. Mild disc bulging at L5-S1 without significant stenosis. The risks, benefits, and expectations of the procedure have been discussed and explained to the patient who was understanding and in agreement with suggested treatment plan. We will proceed with lumbar epidural steroid injection as discussed and as explained to the patient who is willing to proceed with procedure as planned.   DESCRIPTION OF PROCEDURE: Lumbar epidural steroid injection with IV Versed, IV fentanyl conscious sedation, EKG, blood pressure, pulse, and pulse oximetry monitoring. The procedure was performed with the patient in the prone position under fluoroscopic guidance. A local anesthetic skin wheal of 1.5% plain lidocaine was accomplished at proposed entry site. An 18-gauge Tuohy epidural needle was inserted at the L  5 vertebral body level  right of the midline via loss-of-resistance technique with negative heme and negative CSF return. A total of 4 mL of Preservative-Free normal saline with 40 mg of Kenalog injected incrementally via epidurally placed needle. Needle was removed.   Myoneural blocks of the lumbar musketry region Following Betadine prep of proposed  entry site a 22-gauge needle was inserted in the lumbar musculature region and following negative aspiration 2 cc of 0.25% bupivacaine with Norflex was injected for myoneural block injection site performed 4  A total of 40 mg of Kenalog was utilized for the procedure.   The patient tolerated the injection well.    PLAN:   1. Medications: We will continue presently prescribed medications  Flexeril and oxycodone. 2. Will consider modification of treatment regimen pending response to treatment rendered on today's visit and follow-up evaluation. 3. The patient is to follow-up with primary care physician  Dr. Dayton Bailiff blood pressure and general medical condition status post lumbar epidural steroid injection performed on today's visit. 4. Surgical evaluation. Dr. Marikay Alar 5. Neurological evaluation. 6. The patient may be a candidate for radiofrequency procedures, implantation device, and other treatment pending response to treatment and follow-up evaluation. 7. The patient has been advised to adhere to proper body mechanics and avoid activities which appear to aggravate condition. 8. The patient has been advised to call the Pain Management Center prior to scheduled return appointment should there be significant change in condition or should there be sign  The patient is understanding and agrees with the suggested  treatment plan     Review of Systems     Objective:   Physical Exam        Assessment & Plan:

## 2015-08-09 ENCOUNTER — Telehealth: Payer: Self-pay | Admitting: *Deleted

## 2015-08-09 NOTE — Telephone Encounter (Signed)
Left voice mail

## 2015-08-23 ENCOUNTER — Ambulatory Visit: Payer: Medicare Other | Admitting: Pain Medicine

## 2015-08-30 ENCOUNTER — Encounter: Payer: Self-pay | Admitting: Pain Medicine

## 2015-08-30 ENCOUNTER — Ambulatory Visit: Payer: Medicare Other | Attending: Pain Medicine | Admitting: Pain Medicine

## 2015-08-30 VITALS — BP 127/77 | HR 98 | Temp 98.2°F | Resp 16 | Ht 69.0 in | Wt 185.0 lb

## 2015-08-30 DIAGNOSIS — M5481 Occipital neuralgia: Secondary | ICD-10-CM | POA: Insufficient documentation

## 2015-08-30 DIAGNOSIS — M5030 Other cervical disc degeneration, unspecified cervical region: Secondary | ICD-10-CM

## 2015-08-30 DIAGNOSIS — Z9889 Other specified postprocedural states: Secondary | ICD-10-CM | POA: Diagnosis not present

## 2015-08-30 DIAGNOSIS — M5126 Other intervertebral disc displacement, lumbar region: Secondary | ICD-10-CM | POA: Diagnosis not present

## 2015-08-30 DIAGNOSIS — M533 Sacrococcygeal disorders, not elsewhere classified: Secondary | ICD-10-CM | POA: Diagnosis not present

## 2015-08-30 DIAGNOSIS — M5136 Other intervertebral disc degeneration, lumbar region: Secondary | ICD-10-CM | POA: Diagnosis not present

## 2015-08-30 DIAGNOSIS — M4806 Spinal stenosis, lumbar region: Secondary | ICD-10-CM | POA: Diagnosis not present

## 2015-08-30 DIAGNOSIS — M5416 Radiculopathy, lumbar region: Secondary | ICD-10-CM | POA: Diagnosis not present

## 2015-08-30 DIAGNOSIS — M47812 Spondylosis without myelopathy or radiculopathy, cervical region: Secondary | ICD-10-CM

## 2015-08-30 DIAGNOSIS — M545 Low back pain: Secondary | ICD-10-CM | POA: Diagnosis present

## 2015-08-30 DIAGNOSIS — M47817 Spondylosis without myelopathy or radiculopathy, lumbosacral region: Secondary | ICD-10-CM

## 2015-08-30 DIAGNOSIS — M48062 Spinal stenosis, lumbar region with neurogenic claudication: Secondary | ICD-10-CM

## 2015-08-30 MED ORDER — OXYCODONE HCL 10 MG PO TABS: ORAL_TABLET | ORAL | Status: DC

## 2015-08-30 MED ORDER — CYCLOBENZAPRINE HCL 10 MG PO TABS: ORAL_TABLET | ORAL | Status: DC

## 2015-08-30 NOTE — Patient Instructions (Addendum)
PLAN   Continue present medication Flexeril and oxycodone  Caudal epidural steroid to be performed at time return appointment  F/U PCP Dr. Juanetta Snow for evaliation of  BP and general medical  condition  F/U surgical evaluation. May consider pending follow-up evaluations  F/U neurological evaluation. May consider pending follow-up evaluations  May consider radiofrequency rhizolysis or intraspinal procedures pending response to present treatment and F/U evaluation   Patient to call Pain Management Center should patient have concerns prior to scheduled return appointment. Epidural Steroid Injection Patient Information  Description: The epidural space surrounds the nerves as they exit the spinal cord.  In some patients, the nerves can be compressed and inflamed by a bulging disc or a tight spinal canal (spinal stenosis).  By injecting steroids into the epidural space, we can bring irritated nerves into direct contact with a potentially helpful medication.  These steroids act directly on the irritated nerves and can reduce swelling and inflammation which often leads to decreased pain.  Epidural steroids may be injected anywhere along the spine and from the neck to the low back depending upon the location of your pain.   After numbing the skin with local anesthetic (like Novocaine), a small needle is passed into the epidural space slowly.  You may experience a sensation of pressure while this is being done.  The entire block usually last less than 10 minutes.  Conditions which may be treated by epidural steroids:   Low back and leg pain  Neck and arm pain  Spinal stenosis  Post-laminectomy syndrome  Herpes zoster (shingles) pain  Pain from compression fractures  Preparation for the injection:  1. Do not eat any solid food or dairy products within 6 hours of your appointment.  2. You may drink clear liquids up to 2 hours before appointment.  Clear liquids include water, black coffee,  juice or soda.  No milk or cream please. 3. You may take your regular medication, including pain medications, with a sip of water before your appointment  Diabetics should hold regular insulin (if taken separately) and take 1/2 normal NPH dos the morning of the procedure.  Carry some sugar containing items with you to your appointment. 4. A driver must accompany you and be prepared to drive you home after your procedure.  5. Bring all your current medications with your. 6. An IV may be inserted and sedation may be given at the discretion of the physician.   7. A blood pressure cuff, EKG and other monitors will often be applied during the procedure.  Some patients may need to have extra oxygen administered for a short period. 8. You will be asked to provide medical information, including your allergies, prior to the procedure.  We must know immediately if you are taking blood thinners (like Coumadin/Warfarin)  Or if you are allergic to IV iodine contrast (dye). We must know if you could possible be pregnant.  Possible side-effects:  Bleeding from needle site  Infection (rare, may require surgery)  Nerve injury (rare)  Numbness & tingling (temporary)  Difficulty urinating (rare, temporary)  Spinal headache ( a headache worse with upright posture)  Light -headedness (temporary)  Pain at injection site (several days)  Decreased blood pressure (temporary)  Weakness in arm/leg (temporary)  Pressure sensation in back/neck (temporary)  Call if you experience:  Fever/chills associated with headache or increased back/neck pain.  Headache worsened by an upright position.  New onset weakness or numbness of an extremity below the injection site  Hives  or difficulty breathing (go to the emergency room)  Inflammation or drainage at the infection site  Severe back/neck pain  Any new symptoms which are concerning to you  Please note:  Although the local anesthetic injected can often  make your back or neck feel good for several hours after the injection, the pain will likely return.  It takes 3-7 days for steroids to work in the epidural space.  You may not notice any pain relief for at least that one week.  If effective, we will often do a series of three injections spaced 3-6 weeks apart to maximally decrease your pain.  After the initial series, we generally will wait several months before considering a repeat injection of the same type.  If you have any questions, please call [phone] [facility]

## 2015-08-30 NOTE — Progress Notes (Signed)
   Subjective:    Patient ID: Mark Snow, male    DOB: 09/29/1971, 44 y.o.   MRN: [id number]  HPI Patient is 44 year old gentleman returns to [facility] for further evaluation and treatment of pain involving the lower back lower extremity region. Patient is status post surgical intervention of the lumbar regions without plans for additional surgery at the present time. Patient continues to be with significant pain aggravated by standing walking and pain interferes with ability to perform activities of daily living as well as ability to obtain restful sleep. We discussed patient's condition and will proceed interventional treatment at time return appointment. We will consider patient for caudal epidural steroid injection. The patient was in agreement with suggested treatment plan. We will continue oxycodone and Flexeril as prescribed at this time   Review of Systems     Objective:   Physical Exam  There was tenderness of the splenius capitis and occipitalis region of mild degree. There was well-healed surgical scar the cervical region without increased warmth or erythema of the scar. Patient appeared to be with slightly decreased grip strength. Tinel and Phalen's maneuver without increased pain of significant degree. There appeared to be unremarkable Spurling's maneuver. There was tennis of the acromioclavicular and glenohumeral joint region of mild degree. There was tennis over the thoracic facet thoracic paraspinal muscles of mild to moderate degree. No crepitus of the thoracic region noted. Palpation over the lumbar paraspinal muscles lumbar facet region reproduced moderate to moderately severe discomfort. Lateral bending rotation and extension to palpation of the lumbar facets reproduce moderate to moderate severe discomfort. Patient with difficulty attempt to stand on tiptoes and heels. There was tenderness over the PSIS and PII S region of moderate degree. Straight leg raising  limited to approximately 20 without increased pain with dorsiflexion noted. There was negative clonus negative Homans. DTRs trace at the knees. Question decreased sensation L5 dermatomal distribution. There was tenderness of the greater trochanteric region iliotibial band region of mild to moderate degree. There was negative clonus negative Homans. Abdomen nontender no costovertebral maintenance was noted.    Assessment & Plan:     Degenerative disc disease lumbar spine Laminectomy L2 and L3 with decompression of the central canal. 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 Moderate right and mild left foraminal stenosis at L3-4 secondary to lateral disc protrusions and facet hypertrophy. Mild subarticular and mild to moderate foraminal stenosis at L4-5 secondary to acquired and congenital factors worse on the left. Mild disc bulging at L5-S1 without significant stenosis.  Lumbar radiculopathy  Lumbar facet syndrome  Sacroiliac joint dysfunction  Status post surgery of the cervical region  Bilateral occipital neuralgia    PLAN   Continue present medication Flexeril and oxycodone  Caudal epidural steroid injection to be performed at time of return appointment  F/U PCP Dr. Juanetta Gosling for evaliation of  BP and general medical  condition  F/U surgical evaluation. Dr. Marikay Alar  F/U neurological evaluation. May consider pending follow-up evaluations  May consider radiofrequency rhizolysis or intraspinal procedures pending response to present treatment and F/U evaluation   Patient to call [facility] Management Center should patient have concerns prior to scheduled return appointment.

## 2015-08-31 ENCOUNTER — Ambulatory Visit: Payer: Medicare Other | Admitting: Pain Medicine

## 2015-09-05 ENCOUNTER — Ambulatory Visit: Payer: Medicare Other | Admitting: Pain Medicine

## 2015-09-05 ENCOUNTER — Telehealth: Payer: Self-pay | Admitting: Pain Medicine

## 2015-09-05 NOTE — Telephone Encounter (Signed)
Patients ride has stomach bug and cannot bring him today / please cancel appt

## 2015-09-12 ENCOUNTER — Ambulatory Visit: Payer: Medicare Other | Attending: Pain Medicine | Admitting: Pain Medicine

## 2015-09-12 ENCOUNTER — Encounter: Payer: Self-pay | Admitting: Pain Medicine

## 2015-09-12 VITALS — BP 120/81 | HR 88 | Temp 98.3°F | Resp 16 | Ht 69.0 in | Wt 185.0 lb

## 2015-09-12 DIAGNOSIS — M5136 Other intervertebral disc degeneration, lumbar region: Secondary | ICD-10-CM | POA: Diagnosis not present

## 2015-09-12 DIAGNOSIS — M4806 Spinal stenosis, lumbar region: Secondary | ICD-10-CM | POA: Diagnosis not present

## 2015-09-12 DIAGNOSIS — M47817 Spondylosis without myelopathy or radiculopathy, lumbosacral region: Secondary | ICD-10-CM

## 2015-09-12 DIAGNOSIS — M545 Low back pain: Secondary | ICD-10-CM | POA: Diagnosis present

## 2015-09-12 DIAGNOSIS — M48062 Spinal stenosis, lumbar region with neurogenic claudication: Secondary | ICD-10-CM

## 2015-09-12 DIAGNOSIS — M5126 Other intervertebral disc displacement, lumbar region: Secondary | ICD-10-CM | POA: Diagnosis not present

## 2015-09-12 DIAGNOSIS — M47812 Spondylosis without myelopathy or radiculopathy, cervical region: Secondary | ICD-10-CM

## 2015-09-12 DIAGNOSIS — M79605 Pain in left leg: Secondary | ICD-10-CM | POA: Diagnosis present

## 2015-09-12 DIAGNOSIS — M5030 Other cervical disc degeneration, unspecified cervical region: Secondary | ICD-10-CM

## 2015-09-12 DIAGNOSIS — M79604 Pain in right leg: Secondary | ICD-10-CM | POA: Diagnosis present

## 2015-09-12 DIAGNOSIS — M48061 Spinal stenosis, lumbar region without neurogenic claudication: Secondary | ICD-10-CM

## 2015-09-12 MED ORDER — CIPROFLOXACIN HCL 250 MG PO TABS: 250.0000 mg | ORAL_TABLET | Freq: Two times a day (BID) | ORAL | Status: DC

## 2015-09-12 MED ORDER — ORPHENADRINE CITRATE 30 MG/ML IJ SOLN
INTRAMUSCULAR | Status: AC
  Administered 2015-09-12: 11:00:00
  Filled 2015-09-12: qty 2

## 2015-09-12 MED ORDER — SODIUM CHLORIDE 0.9 % IJ SOLN
INTRAMUSCULAR | Status: AC
  Administered 2015-09-12: 11:00:00
  Filled 2015-09-12: qty 20

## 2015-09-12 MED ORDER — MIDAZOLAM HCL 5 MG/5ML IJ SOLN
INTRAMUSCULAR | Status: AC
  Administered 2015-09-12: 5 mg
  Filled 2015-09-12: qty 5

## 2015-09-12 MED ORDER — BUPIVACAINE HCL (PF) 0.25 % IJ SOLN
INTRAMUSCULAR | Status: AC
  Administered 2015-09-12: 11:00:00
  Filled 2015-09-12: qty 30

## 2015-09-12 MED ORDER — FENTANYL CITRATE (PF) 100 MCG/2ML IJ SOLN
INTRAMUSCULAR | Status: AC
  Administered 2015-09-12: 100 ug via INTRAVENOUS
  Filled 2015-09-12: qty 2

## 2015-09-12 MED ORDER — LIDOCAINE HCL (PF) 1 % IJ SOLN
INTRAMUSCULAR | Status: AC
  Administered 2015-09-12: 11:00:00
  Filled 2015-09-12: qty 5

## 2015-09-12 MED ORDER — CIPROFLOXACIN IN D5W 400 MG/200ML IV SOLN
INTRAVENOUS | Status: AC
  Administered 2015-09-12: 400 mg via INTRAVENOUS
  Filled 2015-09-12: qty 200

## 2015-09-12 MED ORDER — TRIAMCINOLONE ACETONIDE 40 MG/ML IJ SUSP
INTRAMUSCULAR | Status: AC
  Administered 2015-09-12: 11:00:00
  Filled 2015-09-12: qty 1

## 2015-09-12 NOTE — Patient Instructions (Addendum)
PLAN  Continue present medication Flexeril and oxycodone and begin taking antibiotic Cipro as prescribed. Please obtain your antibiotic today and begin taking antibiotic Cipro today  F/U PCP Dr. Juanetta Gosling for evaliation of  BP and general medical  condition.  F/U surgical evaluation. Follow-up Dr. Marikay Alar as planned  F/U neurological evaluation. May consider pending follow-up evaluations  May consider radiofrequency rhizolysis or intraspinal procedures pending response to present treatment and F/U evaluation.  Patient to call [facility] should patient have concerns prior to scheduled return appointment.  Epidural Steroid Injection Patient Information  Description: The epidural space surrounds the nerves as they exit the spinal cord.  In some patients, the nerves can be compressed and inflamed by a bulging disc or a tight spinal canal (spinal stenosis).  By injecting steroids into the epidural space, we can bring irritated nerves into direct contact with a potentially helpful medication.  These steroids act directly on the irritated nerves and can reduce swelling and inflammation which often leads to decreased pain.  Epidural steroids may be injected anywhere along the spine and from the neck to the low back depending upon the location of your pain.   After numbing the skin with local anesthetic (like Novocaine), a small needle is passed into the epidural space slowly.  You may experience a sensation of pressure while this is being done.  The entire block usually last less than 10 minutes.  Conditions which may be treated by epidural steroids:   Low back and leg pain  Neck and arm pain  Spinal stenosis  Post-laminectomy syndrome  Herpes zoster (shingles) pain  Pain from compression fractures  Preparation for the injection:   Do not eat any solid food or dairy products within 6 hours of your appointment.   You may drink clear liquids up to 2 hours before  appointment.  Clear liquids include water, black coffee, juice or soda.  No milk or cream please.  You may take your regular medication, including pain medications, with a sip of water before your appointment  Diabetics should hold regular insulin (if taken separately) and take 1/2 normal NPH dos the morning of the procedure.  Carry some sugar containing items with you to your appointment.  A driver must accompany you and be prepared to drive you home after your procedure.   Bring all your current medications with your.  An IV may be inserted and sedation may be given at the discretion of the physician.    A blood pressure cuff, EKG and other monitors will often be applied during the procedure.  Some patients may need to have extra oxygen administered for a short period.  You will be asked to provide medical information, including your allergies, prior to the procedure.  We must know immediately if you are taking blood thinners (like Coumadin/Warfarin)  Or if you are allergic to IV iodine contrast (dye). We must know if you could possible be pregnant.  Possible side-effects:  Bleeding from needle site  Infection (rare, may require surgery)  Nerve injury (rare)  Numbness & tingling (temporary)  Difficulty urinating (rare, temporary)  Spinal headache ( a headache worse with upright posture)  Light -headedness (temporary)  Pain at injection site (several days)  Decreased blood pressure (temporary)  Weakness in arm/leg (temporary)  Pressure sensation in back/neck (temporary)  Call if you experience:  Fever/chills associated with headache or increased back/neck pain.  Headache worsened by an upright position.  New onset weakness or numbness of an  extremity below the injection site  Hives or difficulty breathing (go to the emergency room)  Inflammation or drainage at the infection site  Severe back/neck pain  Any new symptoms which are concerning to you  Please  note:  Although the local anesthetic injected can often make your back or neck feel good for several hours after the injection, the pain will likely return.  It takes 3-7 days for steroids to work in the epidural space.  You may not notice any pain relief for at least that one week.  If effective, we will often do a series of three injections spaced 3-6 weeks apart to maximally decrease your pain.  After the initial series, we generally will wait several months before considering a repeat injection of the same type.  If you have any questions, please call [phone] [facility] Discharge Inst[facility]  General Discharge Instructions :  If you need to reach your doctor call: Monday-Friday 8:00 am - 4:00 pm at [phone] or toll free [phone].  After clinic hours [phone] to have operator reach doctor.  Bring all of your medication bottles to all your appointments in the pain clinic.  To cancel or reschedule your appointment with Pain Management please remember to call 24 hours in advance to avoid a fee.  Refer to the educational materials which you have been given on: General Risks, I had my Procedure. Discharge Instructions, Post Sedation.  Post Procedure Instructions:  The drugs you were given will stay in your system until tomorrow, so for the next 24 hours you should not drive, make any legal decisions or drink any alcoholic beverages.  You may eat anything you prefer, but it is better to start with liquids then soups and crackers, and gradually work up to solid foods.  Please notify your doctor immediately if you have any unusual bleeding, trouble breathing or pain that is not related to your normal pain.  Depending on the type of procedure that was done, some parts of your body may feel week and/or numb.  This usually clears up by tonight or the next day.  Walk with the use of an assistive device or accompanied by an  adult for the 24 hours.  You may use ice on the affected area for the first 24 hours.  Put ice in a Ziploc bag and cover with a towel and place against area 15 minutes on 15 minutes off.  You may switch to heat after 24 hours.Implantable Pain Pump, Care After Pain can be treated many different ways. Sometimes, though, pain can be so severe that nothing makes it go away. Then, an implantable pain pump might be suggested. Implantable means it is put inside your body. It delivers pain medication directly into a blood vessel or the area around the spinal cord. Delivering it right to these areas is like giving the medication extra power. As a result, less medication is often needed. That also should mean fewer side effects. If you just had a pain pump implanted, this is how it will work:  During surgery, the pump was put under your skin. It is usually round and about the size of a hockey puck. It may have been placed under the skin of your belly or perhaps below your collarbone.  The pump is attached to a small plastic tube (catheter) that was inserted into a blood vessel or into the area around the spinal cord.  The pump has a space (reservoir) where the  medication is stored. When it is needed, the pump pushes the medicine through the catheter and into your body. Your health care provider can set the pump to give you a steady flow of medication. Or, it can be set to deliver different amounts of pain medicine at different times.  Your health care provider can refill the reservoir. That is done by inserting a needle through your skin into the pump. AFTER SURGERY   You will stay in a recovery area until the anesthesia has worn off. Your blood pressure and pulse will be checked every so often. If the implantation was an outpatient procedure, you will go home once your body functions are back to normal. Sometimes, but rarely, an overnight stay is needed.  Before you are sent home, your health care team will  make sure the pump is working well. They will also make sure you do not have a reaction to the pain medicine or to the anesthesia.  Tell your health care providers how much pain you are having. Be honest. They need this information to make sure you get the right amount of pain medicine.  You will have a cut (incision), 4 to 6 inches long, on your skin. This will be near where the pump has been implanted. You might also have a small incision on your back. Stitches or staples will be keeping all incisions closed. Also, there will probably be a bandage (dressing) over the incisions.  Be sure to ask your health care provider for a list of things you should not do for the first few days after your surgery.  Before leaving, make an appointment for your first check-up. At this visit, your health care provider will check the pump to make sure it is still working properly. And, the pump can be refilled with pain medicine. HOME CARE INSTRUCTIONS   Only take over-the-counter or prescription medicines for pain, discomfort, or fever as directed by your health care provider.  You should be feeling less pain than before the pump was implanted. Continue to let your health care provider know about your pain. Also explain any side effects you might have. The goal is for the pump to give you enough medicine to control the pain but not so much that you have side effects.  Sometimes fluid leaks from the spinal cord area. This can cause a severe headache (called a spinal headache). The headache can also make you feel dizzy or sick to your stomach. If you have a headache after surgery, let your health care provider know. Treatment for a spinal headache is usually to lie flat on your back. You also should drink fluids and caffeine has been shown to be helpful. Try coffee or tea.  The pain medication used in a pump is sometimes a strong pain reliever (narcotic) that can have side effects. Tell your health care provider if  you have any of these reactions:  You feel sick to your stomach.  You are having trouble breathing or feel short of breath.  You feel more sleepy than usual.  Your skin itches.  You have trouble passing urine.  Do not get the incisions wet for several days (or until your health care provider says that it is okay). You might be asked to avoid soaking baths for several weeks and to shower only.  Follow your health care provider's directions for changing the dressing on the incisions.  Wear loose clothing until your incisions are healed. This will keep the skin around  them from becoming irritated.  At first, limit your movement and activities.  Do not sleep on your stomach.  Avoid significant bending and stretching.  Do not raise your arms over your head.  Do not lift anything that weighs more than 5 pounds.  Do not drive for at least two weeks (or until your health care provider says it is okay).  Do not do work around the house (inside or outside) until your health care provider gives you the go-ahead. For example, do not load the dishwasher. Do not vacuum. Do not mow the yard.  Return to your regular activities gradually. Ask your health care provider whether physical therapy would help. SEEK MEDICAL CARE IF:   An incision becomes red, swollen or painful.  An incision leaks fluid or blood.  You feel sick to your stomach, feel more sleepy than usual, or have itchy skin.  You have trouble urinating or having a bowel movement.  You have a bad headache or back pain.  Your pain is not being relieved by the pump.  You develop a fever of more than 100.64F (38.1C). SEEK IMMEDIATE MEDICAL CARE IF:  You have trouble breathing or feel short of breath.  You have a headache that lasts for more than two days.  You hear the pump beeping.  You have sudden symptoms (back pain, weakness in your legs).  You suddenly lose control over urination or bowel movements.  You  develop a fever of more than 102.42F (38.9C). Document Released: 03/28/2009 Document Revised: 04/26/2014 Document Reviewed: 03/28/2009 [facility] Patient Information 2015 [place], [place]. This information is not intended to replace advice given to you by your health care provider. Make sure you discuss any questions you have with your health care provider.

## 2015-09-12 NOTE — Progress Notes (Signed)
Subjective:    Patient ID: Mark Snow, male    DOB: 1971/06/29, 44 y.o.   MRN: [id number]  HPI  PROCEDURE PERFORMED: Caudal epidural steroid injection   NOTE: The patient is a 44 y.o. male who returns to [facility] for further evaluation and treatment of pain involving the lumbar and lower extremity region. MRI revealed the patient to be with degenerative disc disease lumbar spine Laminectomy L2 and L3 with decompression of the central canal. 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 Moderate right and mild left foraminal stenosis at L3-4 secondary to lateral disc protrusions and facet hypertrophy. Mild subarticular and mild to moderate foraminal stenosis at L4-5 secondary to acquired and congenital factors worse on the left. Mild disc bulging at L5-S1 without significant stenosis.Marland Kitchenthere is concern regarding intraspinal abnormalities contribute to patient's symptomatology to significant degree. Patient will undergo follow-up surgical evaluation with Dr. Marikay Alar as discussed and we will proceed with interventional treatment consisting of caudal epidural steroid injection in attempt to decrease severity of patient's symptoms, minimize progression of symptoms, and avoid the need for more involved treatment.The risks, benefits, and expectations of the procedure have been discussed and explained to the patient who was understanding and in agreement with suggested treatment plan. We will proceed with lumbar epidural steroid injection as discussed and as explained to the patient who is willing to proceed with procedure as planned.   DESCRIPTION OF PROCEDURE: Caudal Epidural Steroid Injection The patient was taken to fluoroscopy suite and seeing the prone position with IV Versed, IV fentanyl conscious sedation, EKG, blood pressure, pulse, and pulse oximetry monitoring. The procedure was performed with the patient in the prone position under fluoroscopic guidance. A  local anesthetic skin wheal of 1.5% plain lidocaine was accomplished at proposed entry site.. A 22-gauge needle was inserted in the caudal canal under fluoroscopic guidance with negative heme and negative CSF return. A total of 20 mL of Preservative-Free normal saline with 40 mg of Kenalog injected incrementally via epidurally placed needle. Needle was removed.  Myoneural block injection of the lumbar region Following Betadine prep of proposed entry site a 22-gauge needle was inserted in the lumbar paraspinal musculature region and following negative aspiration 2 cc of 0.25% bupivacaine with Norflex was injected for myoneural block injection 2    A total of 40 mg of Kenalog was utilized for the procedure.   The patient tolerated the injection well.    PLAN:   1. Medications: We will continue presently prescribed medications. Flexeril and oxycodone 2. Will consider modification of treatment regimen pending response to treatment rendered on today's visit and follow-up evaluation. 3. The patient is to follow-up with primary care physician Dr. Juanetta Gosling regarding blood pressure and general medical condition status post lumbar epidural steroid injection performed on today's visit. 4. Surgical evaluation.. Patient will follow-up with Dr. Marikay Alar as planned 5. Neurological evaluation.. May consider [facility] EMG studies and other studies 6. The patient may be a candidate for radiofrequency procedures, implantation device, and other treatment pending response to treatment and follow-up evaluation. 7. The patient has been advised to adhere to proper body mechanics and avoid activities which appear to aggravate condition. 8. The patient has been advised to call the Pain Management Center prior to scheduled return appointment should there be significant change in condition or should there be sign  The patient is understanding and agrees with the suggested  treatment plan   Review of Systems      Objective:  Physical Exam        Assessment & Plan:

## 2015-09-13 ENCOUNTER — Telehealth: Payer: Self-pay | Admitting: *Deleted

## 2015-09-13 NOTE — Telephone Encounter (Signed)
pt c/o of increase pain in right leg. No s/s of infection noted, denies fever, loss of bowel/ bladder or any new numbness. Instructed to call with any new concerns.

## 2015-09-26 ENCOUNTER — Telehealth: Payer: Self-pay | Admitting: Pain Medicine

## 2015-09-26 NOTE — Telephone Encounter (Signed)
Called patient; patient is to see Dr. Marikay Alar for follow up due to the increased pain per Dr Metta Clines.   Patient has an appointment on Thursday with Dr Metta Clines.

## 2015-09-26 NOTE — Telephone Encounter (Signed)
Pt having increased pain and would like to talk to Dr Crisp.  °

## 2015-09-29 ENCOUNTER — Encounter: Payer: Self-pay | Admitting: Pain Medicine

## 2015-09-29 ENCOUNTER — Ambulatory Visit: Payer: Medicare Other | Attending: Pain Medicine | Admitting: Pain Medicine

## 2015-09-29 VITALS — BP 122/77 | HR 89 | Temp 98.9°F | Resp 16 | Ht 69.0 in | Wt 185.0 lb

## 2015-09-29 DIAGNOSIS — M5481 Occipital neuralgia: Secondary | ICD-10-CM | POA: Insufficient documentation

## 2015-09-29 DIAGNOSIS — M48061 Spinal stenosis, lumbar region without neurogenic claudication: Secondary | ICD-10-CM

## 2015-09-29 DIAGNOSIS — M533 Sacrococcygeal disorders, not elsewhere classified: Secondary | ICD-10-CM | POA: Insufficient documentation

## 2015-09-29 DIAGNOSIS — M5416 Radiculopathy, lumbar region: Secondary | ICD-10-CM | POA: Diagnosis not present

## 2015-09-29 DIAGNOSIS — M4806 Spinal stenosis, lumbar region: Secondary | ICD-10-CM | POA: Diagnosis not present

## 2015-09-29 DIAGNOSIS — M5136 Other intervertebral disc degeneration, lumbar region: Secondary | ICD-10-CM | POA: Diagnosis not present

## 2015-09-29 DIAGNOSIS — M542 Cervicalgia: Secondary | ICD-10-CM | POA: Diagnosis present

## 2015-09-29 DIAGNOSIS — M47812 Spondylosis without myelopathy or radiculopathy, cervical region: Secondary | ICD-10-CM

## 2015-09-29 DIAGNOSIS — M545 Low back pain: Secondary | ICD-10-CM | POA: Diagnosis present

## 2015-09-29 DIAGNOSIS — M47817 Spondylosis without myelopathy or radiculopathy, lumbosacral region: Secondary | ICD-10-CM

## 2015-09-29 DIAGNOSIS — Z9889 Other specified postprocedural states: Secondary | ICD-10-CM | POA: Insufficient documentation

## 2015-09-29 DIAGNOSIS — M5030 Other cervical disc degeneration, unspecified cervical region: Secondary | ICD-10-CM

## 2015-09-29 DIAGNOSIS — M48062 Spinal stenosis, lumbar region with neurogenic claudication: Secondary | ICD-10-CM

## 2015-09-29 MED ORDER — FENTANYL 25 MCG/HR TD PT72: MEDICATED_PATCH | TRANSDERMAL | Status: DC

## 2015-09-29 MED ORDER — OXYCODONE HCL 5 MG PO TABS: ORAL_TABLET | ORAL | Status: DC

## 2015-09-29 NOTE — Patient Instructions (Addendum)
PLAN   Continue present medication and DO NOT TAKE OXYCODONE  . BEGIN FENTANYL PATCH as prescribed CAUTION fentanyl patch can cause respiratory depression excessive sedation confusion and other side effects do not take oxycodone, of the sedating medications, to avoid these serious side effects Please stay in the presence of an adult driver for the first 3-4 days while beginning fentanyl patch and go to emergency room immediately if you develop any of these side effects  F/U PCP Dr Juanetta Gosling for evaliation of  BP and general medical  condition  F/U surgical evaluation. Follow-up with Dr. Marikay Alar as discussed  F/U neurological evaluation. May consider pending follow-up evaluations  May consider radiofrequency rhizolysis or intraspinal procedures pending response to present treatment and F/U evaluation   Patient to call [facility] Management Center should patient have concerns prior to scheduled return appointment. Do not take Oxycodone first 3 days of using the Duragesic patch

## 2015-09-29 NOTE — Progress Notes (Signed)
Safety precautions to be maintained throughout the outpatient stay will include: orient to surroundings, keep bed in low position, maintain call bell within reach at all times, provide assistance with transfer out of bed and ambulation.  

## 2015-09-29 NOTE — Progress Notes (Signed)
Subjective:    Patient ID: Mark Snow, male    DOB: [birth year], 44 y.o.   MRN: [id number]  HPI Patient is 44 year old gentleman who returns to [facility] for further evaluation and treatment of pain involving the region of the neck upper extremity regions lower back and lower extremity regions. Patient stated that he is beginning to have severe pain of the lower back and lower extremity regions. Patient states that he needs an increase of his medications oxycodone. We discussed patient's condition and have advised patient to return to Dr. Marikay Alar for neurosurgical reevaluation of his condition. We will also discussed patient's medications and decision has been made to replace the oxycodone with fentanyl patch. At the present time patient is taking 60 mg  of oxycodone and  Flexeril. Patient denies any drowsiness respiratory depression confusion of undesirable side effects and states that he is wide awake throughout the day without any side effects due to the medications. We cautioned patient regarding respiratory depression confusion excessive sedation and other side effects which can occur with fentanyl patch. We informed patient not to chew the patch not to that the patch of the temporal with patching anyway and to apply the patch and in the area where he can remove the patch himself. The patient expressed understanding and willingness to comply with instructions. The nurses also reiterated instructions to patient and informed patient of the serious side effects of the medications. The patient again expressed understanding and willingness to comply with recommendations to avoid undesirable side effects. The patient will also undergo follow-up evaluation Dr. Marikay Alar as discussed and reevaluate his lumbar lower extremity pain paresthesias and weakness   Review of Systems     Objective:   Physical Exam  There was tenderness of the splenius capitis and occipitalis musculature  region of mild to moderate degree. There was well-healed surgical scar the cervical region without increased warmth or erythema of the scar palpation over the region of the cervical facet cervical paraspinal much surgery reproduced mild discomfort. There was mild tenderness of thoracic facet thoracic paraspinal musculature region. There appeared to be unremarkable Spurling's maneuver. There was mild tinnitus of the acromioclavicular and glenohumeral joint regions. Palpation of the thoracic facet thoracic paraspinal musculature was a tends to palpation of moderate degree with moderate muscle spasms of the mid and lower thoracic musculature region. Palpation over the lumbar paraspinal muscles lumbar facet region was with moderate degree severe tenderness to palpation. Lateral bending and rotation and extension palpation of the lumbar facets reproduce severe discomfort. Straight leg raising was limited to approximately 20 with questionably increased pain with dorsiflexion noted. There was negative clonus negative Homans. DTRs were difficult to elicit at the knees. Abdomen was nontender and no costovertebral tenderness was noted.      Assessment & Plan:    Degenerative disc disease lumbar spine Laminectomy L2 and L3 with decompression of the central canal. 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 Moderate right and mild left foraminal stenosis at L3-4 secondary to lateral disc protrusions and facet hypertrophy. Mild subarticular and mild to moderate foraminal stenosis at L4-5 secondary to acquired and congenital factors worse on the left. Mild disc bulging at L5-S1 without significant stenosis.  Lumbar radiculopathy  Lumbar facet syndrome  Sacroiliac joint dysfunction  Status post surgery of the cervical region  Bilateral occipital neuralgia    PLAN   Continue present medication and STOP OXYCODONE.  Fentanyl patch 25 g per hour to be changed every  3 days was prescribed to  replace oxycodone. The patient will cautioned her regarding respiratory depression excessive sedation confusion and other side effects which can occur with medications. Patient is to remain in the presence of an adult driver for the first 3-4 days without taking the medications and to report to the emergency room immediately if he develops any undesirable side effects. We cautioned patient repeatedly regarding the side effects of the fentanyl patch and advised patient to avoid tampering with the patch and in the way  F/U PCP Dr. Payton Doughty evaliation of  BP and general medical  condition  F/U surgical evaluation. Dr. Marikay Alar as planned  F/U neurological evaluation. May consider pending follow-up evaluations  May consider radiofrequency rhizolysis or intraspinal procedures pending response to present treatment and F/U evaluation   Patient to call [facility] should patient have concerns prior to scheduled return appointment.

## 2015-10-12 ENCOUNTER — Telehealth: Payer: Self-pay

## 2015-10-12 NOTE — Telephone Encounter (Signed)
Nurses  Nurses please see for inpatient medication refill is due and let me know date refill is due I need to know the state prior to our calling patient in scheduling appointment for patient

## 2015-10-12 NOTE — Telephone Encounter (Signed)
Pt states patches are not working and oxycodone will be gone Thursday or Friday

## 2015-10-12 NOTE — Telephone Encounter (Signed)
Last prescribed Oct. 6, TLU 11-02-15

## 2015-10-12 NOTE — Telephone Encounter (Deleted)
Please advise 

## 2015-10-14 ENCOUNTER — Telehealth: Payer: Self-pay | Admitting: Pain Medicine

## 2015-10-14 NOTE — Telephone Encounter (Signed)
Patient says patch was not working and will not stay on / needs refill on oxycodone / wants to speak with nurse or dr crisp

## 2015-10-14 NOTE — Telephone Encounter (Signed)
Dr. Metta Clinesrisp will see patient on Monday 10-17-15 at 7am to discuss medication problems. Patient called per Werner LeanK Wood and given appt. time.

## 2015-10-17 ENCOUNTER — Encounter: Payer: Self-pay | Admitting: Pain Medicine

## 2015-10-17 ENCOUNTER — Ambulatory Visit: Payer: Medicare Other | Attending: Pain Medicine | Admitting: Pain Medicine

## 2015-10-17 VITALS — BP 126/84 | HR 72 | Temp 97.9°F | Resp 16 | Ht 69.0 in | Wt 170.0 lb

## 2015-10-17 DIAGNOSIS — M5126 Other intervertebral disc displacement, lumbar region: Secondary | ICD-10-CM | POA: Insufficient documentation

## 2015-10-17 DIAGNOSIS — M5030 Other cervical disc degeneration, unspecified cervical region: Secondary | ICD-10-CM

## 2015-10-17 DIAGNOSIS — M47812 Spondylosis without myelopathy or radiculopathy, cervical region: Secondary | ICD-10-CM

## 2015-10-17 DIAGNOSIS — M5416 Radiculopathy, lumbar region: Secondary | ICD-10-CM | POA: Insufficient documentation

## 2015-10-17 DIAGNOSIS — Z981 Arthrodesis status: Secondary | ICD-10-CM | POA: Insufficient documentation

## 2015-10-17 DIAGNOSIS — L0231 Cutaneous abscess of buttock: Secondary | ICD-10-CM

## 2015-10-17 DIAGNOSIS — M4806 Spinal stenosis, lumbar region: Secondary | ICD-10-CM | POA: Insufficient documentation

## 2015-10-17 DIAGNOSIS — M5481 Occipital neuralgia: Secondary | ICD-10-CM | POA: Diagnosis not present

## 2015-10-17 DIAGNOSIS — M47817 Spondylosis without myelopathy or radiculopathy, lumbosacral region: Secondary | ICD-10-CM

## 2015-10-17 DIAGNOSIS — M5136 Other intervertebral disc degeneration, lumbar region: Secondary | ICD-10-CM | POA: Diagnosis present

## 2015-10-17 DIAGNOSIS — M48061 Spinal stenosis, lumbar region without neurogenic claudication: Secondary | ICD-10-CM

## 2015-10-17 DIAGNOSIS — M48062 Spinal stenosis, lumbar region with neurogenic claudication: Secondary | ICD-10-CM

## 2015-10-17 MED ORDER — OXYCODONE HCL 10 MG PO TABS: ORAL_TABLET | ORAL | Status: DC

## 2015-10-17 MED ORDER — CYCLOBENZAPRINE HCL 10 MG PO TABS: ORAL_TABLET | ORAL | Status: DC

## 2015-10-17 NOTE — Patient Instructions (Addendum)
Stop use of fentanyl patch and resume oxycodone and  continue use of Flexeril  F/U PCP Dr. Juanetta Snow for evaliation of  BP and general medical  condition  F/U surgical evaluation. Patient will follow-up with Dr. Marikay Alaravid Snow as discussed  F/U neurological evaluation  May consider radiofrequency rhizolysis or intraspinal procedures pending response to present treatment and F/U evaluation   Patient to call [facility] Center should patient have concerns prior to scheduled return appointment  A prescription for FLEXERIL was sent to your pharmacy and should be available for pickup today.  A prescription for OXYCODONE was given to you today.

## 2015-10-17 NOTE — Progress Notes (Signed)
Subjective:    Patient ID: Mark Snow, male    DOB: Dec 03, 1971, 44 y.o.   MRN: [id number]  HPI   patient is 44 year old gentleman who [facility] evaluation and treatment of pain involving the neck upper extremity regions and lower back and lower extremity regions. The patient is status post surgical intervention of both the cervical and the lumbar regions. At the present time patient has been unable to tolerate fentanyl patch due to the patch is not sticking to patient's skin. We will resume patient's oxycodone as previously prescribed and will await surgical follow-up evaluation with Dr. Marikay Alar as planned. The patient was with understanding and in agreement with suggested treatment plan. Patient denies any trauma change in events of daily living the cause change in symptomatology. Patient states that his lumbar lower extremity pain is more intense and patient is requesting an increase of his medication dose. We will await surgical evaluation by Dr. Marikay Alar prior to masking any symptoms and will resume patient's previous oxycodone dose as explained to patient on today's visit who was understanding and in agreement with suggested treatment plan      Review of Systems     Objective:   Physical Exam   there was tenderness over the splenius capitis and occipitalis musculature region with well-healed surgical scar of the cervical region without increased warmth or erythema in the region of the scar. Palpation over the region of the acromioclavicular and glenohumeral joint region was with minimal discomfort. Patient appeared to be with slightly decreased grip strength and Tinel and Phalen's maneuver were without increased pain of significant degree. Range of motion of the cervical spine with unremarkable Spurling's maneuver palpation of the thoracic facet thoracic paraspinal musculature region was with evidence of muscle spasm of moderate degree  especially the lower thoracic region. Palpation over the lumbar paraspinal musculature region lumbar facet region was with moderately severe discomfort with lateral bending and rotation and extension and palpation of the lumbar facets reproducing moderately severe discomfort. There was decreased straight leg raising tolerates approximately 10 without a definite increased pain with dorsiflexion noted. EHL strength was decreased and there was questionable decreased sensation of the L5 dermatomal distribution. There was negative clonus negative Homans. Mild tinnitus of the greater trochanteric region and iliotibial band region as well as mild tenderness over the PSIS and PII S region gluteal and piriformis musculature regions. Abdomen was nontender with no costovertebral angle tenderness noted.      Assessment & Plan:    Degenerative disc disease lumbar spine Laminectomy L2 and L3 with decompression of the central canal. 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 Moderate right and mild left foraminal stenosis at L3-4 secondary to lateral disc protrusions and facet hypertrophy. Mild subarticular and mild to moderate foraminal stenosis at L4-5 secondary to acquired and congenital factors worse on the left. Mild disc bulging at L5-S1 without significant stenosis.  Lumbar radiculopathy  Lumbar facet syndrome  Sacroiliac joint dysfunction  Status post surgery of the cervical region  Bilateral occipital neuralgia      Plan   Discontinue use of fentanyl patch and resume oxycodone as previously prescribed oxycodone 10 mg limit 3-6 tabs per day and continue Flexeril as prescribed  F/U PCP Dr. Juanetta Gosling for evaliation of  BP and general medical  condition  F/U surgical evaluation. Dr. Marikay Alar as planned  F/U neurological evaluation. May consider pending follow-up evaluations  May consider radiofrequency rhizolysis or intraspinal procedures  pending response to present  treatment and F/U evaluation   [facility] to call [facility] should patient have concerns prior to scheduled return appointment.

## 2015-10-18 ENCOUNTER — Other Ambulatory Visit: Payer: Self-pay | Admitting: Pain Medicine

## 2015-10-27 ENCOUNTER — Encounter: Payer: Medicare Other | Admitting: Pain Medicine

## 2015-11-15 ENCOUNTER — Ambulatory Visit: Payer: Medicare Other | Attending: Pain Medicine | Admitting: Pain Medicine

## 2015-11-15 ENCOUNTER — Encounter: Payer: Self-pay | Admitting: Pain Medicine

## 2015-11-15 VITALS — BP 131/82 | HR 91 | Temp 98.1°F | Resp 16 | Ht 69.0 in | Wt 185.0 lb

## 2015-11-15 DIAGNOSIS — M47817 Spondylosis without myelopathy or radiculopathy, lumbosacral region: Secondary | ICD-10-CM

## 2015-11-15 DIAGNOSIS — M5136 Other intervertebral disc degeneration, lumbar region: Secondary | ICD-10-CM

## 2015-11-15 DIAGNOSIS — M48062 Spinal stenosis, lumbar region with neurogenic claudication: Secondary | ICD-10-CM

## 2015-11-15 DIAGNOSIS — M79604 Pain in right leg: Secondary | ICD-10-CM | POA: Diagnosis present

## 2015-11-15 DIAGNOSIS — M5481 Occipital neuralgia: Secondary | ICD-10-CM | POA: Insufficient documentation

## 2015-11-15 DIAGNOSIS — Z9889 Other specified postprocedural states: Secondary | ICD-10-CM | POA: Diagnosis not present

## 2015-11-15 DIAGNOSIS — M48061 Spinal stenosis, lumbar region without neurogenic claudication: Secondary | ICD-10-CM

## 2015-11-15 DIAGNOSIS — M4806 Spinal stenosis, lumbar region: Secondary | ICD-10-CM | POA: Insufficient documentation

## 2015-11-15 DIAGNOSIS — M533 Sacrococcygeal disorders, not elsewhere classified: Secondary | ICD-10-CM | POA: Insufficient documentation

## 2015-11-15 DIAGNOSIS — M5116 Intervertebral disc disorders with radiculopathy, lumbar region: Secondary | ICD-10-CM | POA: Diagnosis not present

## 2015-11-15 DIAGNOSIS — M47812 Spondylosis without myelopathy or radiculopathy, cervical region: Secondary | ICD-10-CM

## 2015-11-15 DIAGNOSIS — M5126 Other intervertebral disc displacement, lumbar region: Secondary | ICD-10-CM | POA: Insufficient documentation

## 2015-11-15 DIAGNOSIS — M79605 Pain in left leg: Secondary | ICD-10-CM | POA: Diagnosis present

## 2015-11-15 DIAGNOSIS — M5030 Other cervical disc degeneration, unspecified cervical region: Secondary | ICD-10-CM

## 2015-11-15 DIAGNOSIS — M545 Low back pain: Secondary | ICD-10-CM | POA: Diagnosis present

## 2015-11-15 MED ORDER — OXYCODONE HCL 10 MG PO TABS: ORAL_TABLET | ORAL | Status: DC

## 2015-11-15 MED ORDER — CYCLOBENZAPRINE HCL 10 MG PO TABS: ORAL_TABLET | ORAL | Status: DC

## 2015-11-15 NOTE — Progress Notes (Signed)
Safety precautions to be maintained throughout the outpatient stay will include: orient to surroundings, keep bed in low position, maintain call bell within reach at all times, provide assistance with transfer out of bed and ambulation.  

## 2015-11-15 NOTE — Progress Notes (Signed)
   Subjective:    Patient ID: Mark LeffKenneth S Masur, male    DOB: 13-Mar-1971, 44 y.o.   MRN: [id number]  HPI  The patient is a 44 year old gentleman who returns to pain management for further evaluation and treatment of pain involving the lower back and lower extremity region especially the right lower extremity region. Patient also is with history of pain involving the neck and upper extremity region as well as with prior surgical intervention of both the cervical and lumbar regions. The patient states that he has exacerbation of his pain. We have advised patient to undergo reevaluation with neurosurgeon Dr. Marikay Alaravid [name]. We will continue medications as prescribed and as explained to patient and will consider patient for lumbosacral selective nerve root block to be performed at time return appointment in attempt to decrease severity of symptoms, minimize progression of symptoms, and avoid need for more involved treatment. The patient was in agreement with suggested treatment plan.  Review of Systems     Objective:   Physical Exam  There was tenderness to palpation of the splenius capitis and a separate talus musculature regions of mild degree. There was well-healed surgical scar of the cervical region without increased warmth and erythema in the region of the scar. Patient appeared to be with slightly decreased grip strength. Tinel and Phalen's maneuver were without increase of pain of significant degree. There was tenderness of the acromioclavicular and glenohumeral joint regions of mild degree and patient appeared to be unremarkable Spurling's maneuver. Palpation over the thoracic facet thoracic paraspinal muscles reproduces pain of moderate degree with moderate muscle spasms in the lower thoracic paraspinal musculature region. Palpation over the lumbar paraspinal muscles lumbar facet region was attends to palpation of moderately severe degree. Lateral bending rotation extension and palpation of the  lumbar facets reproduced moderately severe discomfort. There was tenderness to palpation over the gluteal and piriformis musculature region a moderate degree. Straight leg raise was tolerates approximately 20 with questionably increased pain with dorsiflexion noted. There was negative clonus negative Homans. DTRs were difficult to elicit patient had difficulty relaxing. There was mild tenderness of the greater trochanteric region and iliotibial band region abdomen was nontender and no costovertebral tenderness was noted.       Assessment & Plan:  Degenerative disc disease lumbar spine Laminectomy L2 and L3 with decompression of the central canal. 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 Moderate right and mild left foraminal stenosis at L3-4 secondary to lateral disc protrusions and facet hypertrophy. Mild subarticular and mild to moderate foraminal stenosis at L4-5 secondary to acquired and congenital factors worse on the left. Mild disc bulging at L5-S1 without significant stenosis.  Lumbar radiculopathy  Lumbar facet syndrome  Sacroiliac joint dysfunction  Status post surgery of the cervical region  Bilateral occipital neuralgia    PLAN   Continue present medications oxycodone and  Flexeril  Lumbosacral selective nerve root block to be performed at time return appointment  F/U PCP Dr. Juanetta [name] for evaliation of  BP and general medical  condition  F/U surgical evaluation. Patient will follow-up with Dr. Marikay Alaravid [name] as discussed  F/U neurological evaluation  May consider radiofrequency rhizolysis or intraspinal procedures pending response to present treatment and F/U evaluation   Patient to call [facility] should patient have concerns prior to scheduled return appointmen

## 2015-11-15 NOTE — Patient Instructions (Addendum)
Continue present medications oxycodone and  Flexeril  Lumbosacral selective nerve root block to be performed at time return appointment  F/U PCP Dr. Juanetta [name] for evaliation of  BP and general medical  condition  F/U surgical evaluation. Patient will follow-up with Dr. Marikay Alaravid [name] as discussed  F/U neurological evaluation  May consider radiofrequency rhizolysis or intraspinal procedures pending response to present treatment and F/U evaluation   Patient to call [facility] should patient have concerns prior to scheduled return appointmenGENERAL RISKS AND COMPLICATIONS  What are the risk, side effects and possible complications?t Generally speaking, most procedures are safe.  However, with any procedure there are risks, side effects, and the possibility of complications.  The risks and complications are dependent upon the sites that are lesioned, or the type of nerve block to be performed.  The closer the procedure is to the spine, the more serious the risks are.  Great care is taken when placing the radio frequency needles, block needles or lesioning probes, but sometimes complications can occur. 1. Infection: Any time there is an injection through the skin, there is a risk of infection.  This is why sterile conditions are used for these blocks.  There are four possible types of infection. 1. Localized skin infection. 2. Central Nervous System Infection-This can be in the form of Meningitis, which can be deadly. 3. Epidural Infections-This can be in the form of an epidural abscess, which can cause pressure inside of the spine, causing compression of the spinal cord with subsequent paralysis. This would require an emergency surgery to decompress, and there are no guarantees that the patient would recover from the paralysis. 4. Discitis-This is an infection of the intervertebral discs.  It occurs in about 1% of discography procedures.  It is difficult to treat and it may lead to  surgery.        2. Pain: the needles have to go through skin and soft tissues, will cause soreness.       3. Damage to internal structures:  The nerves to be lesioned may be near blood vessels or    other nerves which can be potentially damaged.       4. Bleeding: Bleeding is more common if the patient is taking blood thinners such as  aspirin, Coumadin, Ticiid, Plavix, etc., or if he/she have some genetic predisposition  such as hemophilia. Bleeding into the spinal canal can cause compression of the spinal  cord with subsequent paralysis.  This would require an emergency surgery to  decompress and there are no guarantees that the patient would recover from the  paralysis.       5. Pneumothorax:  Puncturing of a lung is a possibility, every time a needle is introduced in  the area of the chest or upper back.  Pneumothorax refers to free air around the  collapsed lung(s), inside of the thoracic cavity (chest cavity).  Another two possible  complications related to a similar event would include: Hemothorax and Chylothorax.   These are variations of the Pneumothorax, where instead of air around the collapsed  lung(s), you may have blood or chyle, respectively.       6. Spinal headaches: They may occur with any procedures in the area of the spine.       7. Persistent CSF (Cerebro-Spinal Fluid) leakage: This is a rare problem, but may occur  with prolonged intrathecal or epidural catheters either due to the formation of a fistulous  track or a dural tear.  8. Nerve damage: By working so close to the spinal cord, there is always a possibility of  nerve damage, which could be as serious as a permanent spinal cord injury with  paralysis.       9. Death:  Although rare, severe deadly allergic reactions known as "Anaphylactic  reaction" can occur to any of the medications used.      10. Worsening of the symptoms:  We can always make thing worse.  What are the chances of something like this  happening? Chances of any of this occuring are extremely low.  By statistics, you have more of a chance of getting killed in a motor vehicle accident: while driving to the hospital than any of the above occurring .  Nevertheless, you should be aware that they are possibilities.  In general, it is similar to taking a shower.  Everybody knows that you can slip, hit your head and get killed.  Does that mean that you should not shower again?  Nevertheless always keep in mind that statistics do not mean anything if you happen to be on the wrong side of them.  Even if a procedure has a 1 (one) in a 1,000,000 (million) chance of going wrong, it you happen to be that one..Also, keep in mind that by statistics, you have more of a chance of having something go wrong when taking medications.  Who should not have this procedure? If you are on a blood thinning medication (e.g. Coumadin, Plavix, see list of "Blood Thinners"), or if you have an active infection going on, you should not have the procedure.  If you are taking any blood thinners, please inform your physician.  How should I prepare for this procedure?  Do not eat or drink anything at least six hours prior to the procedure.  Bring a driver with you .  It cannot be a taxi.  Come accompanied by an adult that can drive you back, and that is strong enough to help you if your legs get weak or numb from the local anesthetic.  Take all of your medicines the morning of the procedure with just enough water to swallow them.  If you have diabetes, make sure that you are scheduled to have your procedure done first thing in the morning, whenever possible.  If you have diabetes, take only half of your insulin dose and notify our nurse that you have done so as soon as you arrive at the clinic.  If you are diabetic, but only take blood sugar pills (oral hypoglycemic), then do not take them on the morning of your procedure.  You may take them after you have had the  procedure.  Do not take aspirin or any aspirin-containing medications, at least eleven (11) days prior to the procedure.  They may prolong bleeding.  Wear loose fitting clothing that may be easy to take off and that you would not mind if it got stained with Betadine or blood.  Do not wear any jewelry or perfume  Remove any nail coloring.  It will interfere with some of our monitoring equipment.  NOTE: Remember that this is not meant to be interpreted as a complete list of all possible complications.  Unforeseen problems may occur.  BLOOD THINNERS The following drugs contain aspirin or other products, which can cause increased bleeding during surgery and should not be taken for 2 weeks prior to and 1 week after surgery.  If you should need take something for relief of minor  pain, you may take acetaminophen which is found in Tylenol,m Datril, Anacin-3 and Panadol. It is not blood thinner. The products listed below are.  Do not take any of the products listed below in addition to any listed on your instruction sheet.  A.P.C or A.P.C with Codeine Codeine Phosphate Capsules #3 Ibuprofen Ridaura  ABC compound Congesprin Imuran rimadil  Advil Cope Indocin Robaxisal  Alka-Seltzer Effervescent Pain Reliever and Antacid Coricidin or Coricidin-D  Indomethacin Rufen  Alka-Seltzer plus Cold Medicine Cosprin Ketoprofen S-A-C Tablets  Anacin Analgesic Tablets or Capsules Coumadin Korlgesic Salflex  Anacin Extra Strength Analgesic tablets or capsules CP-2 Tablets Lanoril Salicylate  Anaprox Cuprimine Capsules Levenox Salocol  Anexsia-D Dalteparin Magan Salsalate  Anodynos Darvon compound Magnesium Salicylate Sine-off  Ansaid Dasin Capsules Magsal Sodium Salicylate  Anturane Depen Capsules Marnal Soma  APF Arthritis pain formula Dewitt's Pills Measurin Stanback  Argesic Dia-Gesic Meclofenamic Sulfinpyrazone  Arthritis Bayer Timed Release Aspirin Diclofenac Meclomen Sulindac  Arthritis pain formula  Anacin Dicumarol Medipren Supac  Analgesic (Safety coated) Arthralgen Diffunasal Mefanamic Suprofen  Arthritis Strength Bufferin Dihydrocodeine Mepro Compound Suprol  Arthropan liquid Dopirydamole Methcarbomol with Aspirin Synalgos  ASA tablets/Enseals Disalcid Micrainin Tagament  Ascriptin Doan's Midol Talwin  Ascriptin A/D Dolene Mobidin Tanderil  Ascriptin Extra Strength Dolobid Moblgesic Ticlid  Ascriptin with Codeine Doloprin or Doloprin with Codeine Momentum Tolectin  Asperbuf Duoprin Mono-gesic Trendar  Aspergum Duradyne Motrin or Motrin IB Triminicin  Aspirin plain, buffered or enteric coated Durasal Myochrisine Trigesic  Aspirin Suppositories Easprin Nalfon Trillsate  Aspirin with Codeine Ecotrin Regular or Extra Strength Naprosyn Uracel  Atromid-S Efficin Naproxen Ursinus  Auranofin Capsules Elmiron Neocylate Vanquish  Axotal Emagrin Norgesic Verin  Azathioprine Empirin or Empirin with Codeine Normiflo Vitamin E  Azolid Emprazil Nuprin Voltaren  Bayer Aspirin plain, buffered or children's or timed BC Tablets or powders Encaprin Orgaran Warfarin Sodium  Buff-a-Comp Enoxaparin Orudis Zorpin  Buff-a-Comp with Codeine Equegesic Os-Cal-Gesic   Buffaprin Excedrin plain, buffered or Extra Strength Oxalid   Bufferin Arthritis Strength Feldene Oxphenbutazone   Bufferin plain or Extra Strength Feldene Capsules Oxycodone with Aspirin   Bufferin with Codeine Fenoprofen Fenoprofen Pabalate or Pabalate-SF   Buffets II Flogesic Panagesic   Buffinol plain or Extra Strength Florinal or Florinal with Codeine Panwarfarin   Buf-Tabs Flurbiprofen Penicillamine   Butalbital Compound Four-way cold tablets Penicillin   Butazolidin Fragmin Pepto-Bismol   Carbenicillin Geminisyn Percodan   Carna Arthritis Reliever Geopen Persantine   Carprofen Gold's salt Persistin   Chloramphenicol Goody's Phenylbutazone   Chloromycetin Haltrain Piroxlcam   Clmetidine heparin Plaquenil   Cllnoril Hyco-pap  Ponstel   Clofibrate Hydroxy chloroquine Propoxyphen         Before stopping any of these medications, be sure to consult the physician who ordered them.  Some, such as Coumadin (Warfarin) are ordered to prevent or treat serious conditions such as "deep thrombosis", "pumonary embolisms", and other heart problems.  The amount of time that you may need off of the medication may also vary with the medication and the reason for which you were taking it.  If you are taking any of these medications, please make sure you notify your pain physician before you undergo any procedures.         Selective Nerve Root Block Patient Information  Description: Specific nerve roots exit the spinal canal and these nerves can be compressed and inflamed by a bulging disc and bone spurs.  By injecting steroids on the nerve root, we can potentially decrease the inflammation  surrounding these nerves, which often leads to decreased pain.  Also, by injecting local anesthesia on the nerve root, this can provide [place] helpful information to give to your referring doctor if it decreases your pain.  Selective nerve root blocks can be done along the spine from the neck to the low back depending on the location of your pain.   After numbing the skin with local anesthesia, a small needle is passed to the nerve root and the position of the needle is verified using x-ray pictures.  After the needle is in correct position, we then deposit the medication.  You may experience a pressure sensation while this is being done.  The entire block usually lasts less than 15 minutes.  Conditions that may be treated with selective nerve root blocks:  Low back and leg pain  Spinal stenosis  Diagnostic block prior to potential surgery  Neck and arm pain  Post laminectomy syndrome  Preparation for the injection:  1. Do not eat any solid food or dairy products within 6 hours of your appointment. 2. You may drink clear liquids up to 2  hours before an appointment.  Clear liquids include water, black coffee, juice or soda.  No milk or cream please. 3. You may take your regular medications, including pain medications, with a sip of water before your appointment.  Diabetics should hold regular insulin (if taken separately) and take 1/2 normal NPH dose the morning of the procedure.  Carry some sugar containing items with you to your appointment. 4. A driver must accompany you and be prepared to drive you home after your procedure. 5. Bring all your current medications with you. 6. An IV may be inserted and sedation may be given at the discretion of the physician. 7. A blood pressure cuff, EKG, and other monitors will often be applied during the procedure.  Some patients may need to have extra oxygen administered for a short period. 8. You will be asked to provide medical information, including allergies, prior to the procedure.  We must know immediately if you are taking blood  Thinners (like Coumadin) or if you are allergic to IV iodine contrast (dye).  Possible side-effects: All are usually temporary  Bleeding from needle site  Light headedness  Numbness and tingling  Decreased blood pressure  Weakness in arms/legs  Pressure sensation in back/neck  Pain at injection site (several days)  Possible complications: All are extremely rare  Infection  Nerve injury  Spinal headache (a headache wore with upright position)  Call if you experience:  Fever/chills associated with headache or increased back/neck pain  Headache worsened by an upright position  New onset weakness or numbness of an extremity below the injection site  Hives or difficulty breathing (go to the emergency room)  Inflammation or drainage at the injection site(s)  Severe back/neck pain greater than usual  New symptoms which are concerning to you  Please note:  Although the local anesthetic injected can often make your back or neck feel  good for several hours after the injection the pain will likely return.  It takes 3-5 days for steroids to work on the nerve root. You may not notice any pain relief for at least one week.  If effective, we will often do a series of 3 injections spaced 3-6 weeks apart to maximally decrease your pain.    If you have any questions, please call [phone] [facility]

## 2015-11-23 ENCOUNTER — Ambulatory Visit: Payer: Medicare Other | Admitting: Pain Medicine

## 2015-12-05 ENCOUNTER — Ambulatory Visit: Payer: Medicare Other | Admitting: Pain Medicine

## 2015-12-14 ENCOUNTER — Ambulatory Visit: Payer: Medicare Other | Attending: Pain Medicine | Admitting: Pain Medicine

## 2015-12-14 ENCOUNTER — Encounter: Payer: Self-pay | Admitting: Pain Medicine

## 2015-12-14 VITALS — BP 132/85 | HR 98 | Temp 98.4°F | Resp 18 | Ht 69.0 in | Wt 190.0 lb

## 2015-12-14 DIAGNOSIS — M79602 Pain in left arm: Secondary | ICD-10-CM | POA: Diagnosis present

## 2015-12-14 DIAGNOSIS — Z9889 Other specified postprocedural states: Secondary | ICD-10-CM | POA: Diagnosis not present

## 2015-12-14 DIAGNOSIS — M4806 Spinal stenosis, lumbar region: Secondary | ICD-10-CM | POA: Insufficient documentation

## 2015-12-14 DIAGNOSIS — M533 Sacrococcygeal disorders, not elsewhere classified: Secondary | ICD-10-CM | POA: Diagnosis not present

## 2015-12-14 DIAGNOSIS — M5030 Other cervical disc degeneration, unspecified cervical region: Secondary | ICD-10-CM

## 2015-12-14 DIAGNOSIS — M5116 Intervertebral disc disorders with radiculopathy, lumbar region: Secondary | ICD-10-CM | POA: Insufficient documentation

## 2015-12-14 DIAGNOSIS — M79601 Pain in right arm: Secondary | ICD-10-CM | POA: Diagnosis present

## 2015-12-14 DIAGNOSIS — M47817 Spondylosis without myelopathy or radiculopathy, lumbosacral region: Secondary | ICD-10-CM

## 2015-12-14 DIAGNOSIS — M48062 Spinal stenosis, lumbar region with neurogenic claudication: Secondary | ICD-10-CM

## 2015-12-14 DIAGNOSIS — Z981 Arthrodesis status: Secondary | ICD-10-CM | POA: Diagnosis not present

## 2015-12-14 DIAGNOSIS — M542 Cervicalgia: Secondary | ICD-10-CM | POA: Diagnosis present

## 2015-12-14 DIAGNOSIS — M47812 Spondylosis without myelopathy or radiculopathy, cervical region: Secondary | ICD-10-CM

## 2015-12-14 DIAGNOSIS — M48061 Spinal stenosis, lumbar region without neurogenic claudication: Secondary | ICD-10-CM

## 2015-12-14 DIAGNOSIS — M5136 Other intervertebral disc degeneration, lumbar region: Secondary | ICD-10-CM

## 2015-12-14 MED ORDER — CYCLOBENZAPRINE HCL 10 MG PO TABS: ORAL_TABLET | ORAL | Status: DC

## 2015-12-14 MED ORDER — OXYCODONE HCL 10 MG PO TABS: ORAL_TABLET | ORAL | Status: DC

## 2015-12-14 NOTE — Progress Notes (Signed)
Safety precautions to be maintained throughout the outpatient stay will include: orient to surroundings, keep bed in low position, maintain call bell within reach at all times, provide assistance with transfer out of bed and ambulation.  

## 2015-12-14 NOTE — Patient Instructions (Addendum)
Continue present medications oxycodone and  Flexeril  F/U PCP Dr. Juanetta [name] for evaliation of  BP and general medical  condition  F/U surgical evaluation. Patient will follow-up with Dr. Marikay Alaravid [name] as discussed  F/U neurological evaluation May consider [facility] EMG studies and other studies  May consider radiofrequency rhizolysis or intraspinal procedures pending response to present treatment and F/U evaluation   Patient to call [facility] should patient have concerns prior to scheduled return appointment

## 2015-12-14 NOTE — Progress Notes (Signed)
Subjective:    Patient ID: Mark Snow, male    DOB: 08/06/1971, 44 y.o.   MRN: [id number]  HPI   the patient is a 44 year old gentleman who returns to pain management for further evaluation and treatment of pain involving the neck upper extremity regions as well as the lower back and lower extremity regions. The patient is status post surgical intervention of the cervical region as well as the lumbar region. At the present time patient has had exacerbation of lower back and lower extremity pain. The patient is scheduled to undergo follow-up evaluation with Mark Snow  To discuss patient's condition. Patient will review CT myelogram findings and will be considered for additional modifications of treatment regimen pending results of CT myelogram. We discussed patient's condition on today's visit the patient will continue oxycodone and Flexeril. We will remain available to consider patient for additional modifications of treatment regimen pending surgical disposition of Dr. Yetta Snow and follow-up evaluation of patient and pain management. The patient was with understanding and agreed with suggested treatment plan.     Review of Systems     Objective:   Physical Exam    There was  tenderness to palpation over the paraspinal musculatures and the cervical region cervical facet region with well-healed surgical scar of the cervical region without increased warmth and erythema in the region scar. There was tenderness of the acromioclavicular and glenohumeral joint region. The patient appeared to be with unremarkable Spurling's maneuver and appeared to be with bilaterally equal grip strength. Tinel and Phalen's maneuver were without increase of pain of significant degree. Palpation over the thoracic facet thoracic paraspinal musculature region was with mild to discomfort. No crepitus of the thoracic region was noted.. Palpation over the lumbar paraspinal must reason lumbar facet region was of  increased pain of moderately severe degree. Lateral bending rotation extension and palpation of the lumbar facets reproduced moderately severe discomfort. Straight leg raising was decreased significantly to 20 without a definite increased pain with dorsiflexion noted. There was questionably decreased sensation of the L5 dermatomal distribution and there was negative clonus negative Homans. Palpation over the PSIS and PII S region as well as the gluteal and piriformis musculature regions reproduced moderately severe discomfort as well. There was mild tenderness of the greater trochanteric region and iliotibial band region. Abdomen was nontender with no costovertebral angle tenderness noted.      Assessment & Plan:    Degenerative disc disease lumbar spine Laminectomy L2 and L3 with decompression of the central canal. 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 Moderate right and mild left foraminal stenosis at L3-4 secondary to lateral disc protrusions and facet hypertrophy. Mild subarticular and mild to moderate foraminal stenosis at L4-5 secondary to acquired and congenital factors worse on the left. Mild disc bulging at L5-S1 without significant stenosis.  Lumbar radiculopathy  Lumbar facet syndrome  Sacroiliac joint dysfunction  Status post surgery of the cervical region  The patient is status post C5-C7 anterior cervical fusion and C5-C6 posterior cervical fusion with bone graft material posteriorly. No hardware complications. There is straightening of the normal cervical lordosis which appears stable    PLAN  Continue present medications oxycodone and  Flexeril  F/U PCP Mark Snow for evaliation of  BP and general medical  condition  F/U surgical evaluation. Patient will follow-up with Mark Snow as discussed  F/U neurological evaluation May consider PNCV EMG studies and other studies  May consider radiofrequency rhizolysis or intraspinal procedures pending  response to present treatment and F/U evaluation   Patient to call [facility] should patient have concerns prior to scheduled return appointment

## 2016-01-11 ENCOUNTER — Encounter: Payer: Self-pay | Admitting: Pain Medicine

## 2016-01-11 ENCOUNTER — Ambulatory Visit: Payer: Medicare Other | Attending: Pain Medicine | Admitting: Pain Medicine

## 2016-01-11 VITALS — BP 126/81 | HR 91 | Temp 97.9°F | Resp 16 | Ht 69.0 in | Wt 185.0 lb

## 2016-01-11 DIAGNOSIS — M5116 Intervertebral disc disorders with radiculopathy, lumbar region: Secondary | ICD-10-CM | POA: Insufficient documentation

## 2016-01-11 DIAGNOSIS — M48062 Spinal stenosis, lumbar region with neurogenic claudication: Secondary | ICD-10-CM

## 2016-01-11 DIAGNOSIS — M5030 Other cervical disc degeneration, unspecified cervical region: Secondary | ICD-10-CM

## 2016-01-11 DIAGNOSIS — M5126 Other intervertebral disc displacement, lumbar region: Secondary | ICD-10-CM | POA: Diagnosis not present

## 2016-01-11 DIAGNOSIS — Z9889 Other specified postprocedural states: Secondary | ICD-10-CM | POA: Insufficient documentation

## 2016-01-11 DIAGNOSIS — M47812 Spondylosis without myelopathy or radiculopathy, cervical region: Secondary | ICD-10-CM

## 2016-01-11 DIAGNOSIS — M4806 Spinal stenosis, lumbar region: Secondary | ICD-10-CM | POA: Insufficient documentation

## 2016-01-11 DIAGNOSIS — M542 Cervicalgia: Secondary | ICD-10-CM | POA: Diagnosis present

## 2016-01-11 DIAGNOSIS — M47817 Spondylosis without myelopathy or radiculopathy, lumbosacral region: Secondary | ICD-10-CM

## 2016-01-11 DIAGNOSIS — M545 Low back pain: Secondary | ICD-10-CM | POA: Diagnosis present

## 2016-01-11 DIAGNOSIS — M5136 Other intervertebral disc degeneration, lumbar region: Secondary | ICD-10-CM

## 2016-01-11 DIAGNOSIS — Z981 Arthrodesis status: Secondary | ICD-10-CM | POA: Insufficient documentation

## 2016-01-11 DIAGNOSIS — M48061 Spinal stenosis, lumbar region without neurogenic claudication: Secondary | ICD-10-CM

## 2016-01-11 DIAGNOSIS — M533 Sacrococcygeal disorders, not elsewhere classified: Secondary | ICD-10-CM | POA: Diagnosis not present

## 2016-01-11 MED ORDER — CYCLOBENZAPRINE HCL 10 MG PO TABS: ORAL_TABLET | ORAL | Status: DC

## 2016-01-11 MED ORDER — OXYCODONE HCL 10 MG PO TABS: ORAL_TABLET | ORAL | Status: DC

## 2016-01-11 NOTE — Patient Instructions (Signed)
PLAN   Continue present medications oxycodone and  Flexeril  F/U PCP Dr. Juanetta Gosling for evaliation of  BP and general medical  condition  F/U surgical evaluation. Patient will follow-up with Dr. Marikay Alar as discussed and review MRI results  F/U neurological evaluation May consider PNCV EMG studies and other studies  May consider radiofrequency rhizolysis or intraspinal procedures pending response to present treatment and F/U evaluation   Patient to call [facility] should patient have concerns prior to scheduled return appointment

## 2016-01-11 NOTE — Progress Notes (Signed)
Safety precautions to be maintained throughout the outpatient stay will include: orient to surroundings, keep bed in low position, maintain call bell within reach at all times, provide assistance with transfer out of bed and ambulation.  

## 2016-01-11 NOTE — Progress Notes (Signed)
   Subjective:    Patient ID: Mark Snow, male    DOB: 1971-05-31, 45 y.o.   MRN: [id number]  HPI  The patient is a 45 year old gentleman who returns to pain management for further evaluation and treatment of pain involving the neck lower back and lower extremity region and especially the right lower extremity region. The patient is undergone lumbar MRI and will follow-up with Dr. Marikay Alar to discuss the results of the lumbar MRI. Pending results of neurosurgical evaluation by Dr. Marikay Alar, will consider additional modifications of treatment regimen. The patient is understanding and agreement suggested treatment plan. Intake and oxycodone and Flexeril as prescribed at this time the patient continues to have severe disabling pain of the lumbar lower extremity region especially the right lower extremity associated with weakness as well.  Review of Systems     Objective:   Physical Exam There was tenderness to palpation of paraspinal musculature in the cervical region cervical facet region with well-healed surgical scar of the cervical region without increased warmth and erythema in the region of the scar. There was tends to palpation thoracic facet thoracic paraspinal musculature region a moderate degree with moderate muscle spasms involving the thoracic region. There was tennis of the splenius capitis and occipitalis musculature regions of moderate degree. The patient appeared to be with slightly decreased grip strength. Tinel and Phalen's maneuver were without increased pain of significant degree. There was tends to palpation over the lumbar paraspinal must reason lumbar facet region of severe degree on the right compared to left with severe tenderness to palpation of the PSIS and PII S region as well as the gluteal and piriformis musculature region. Straight leg raise was limited to approximately 20 without questionable increased pain with dorsiflexion noted. EHL strength appeared to be  decreased. There is question decreased sensation L5 dermatomal distribution with negative clonus negative Homans. Abdomen was nontender and no costovertebral tenderness was noted.       Assessment & Plan:  Degenerative disc disease lumbar spine Laminectomy L2 and L3 with decompression of the central canal. 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 Moderate right and mild left foraminal stenosis at L3-4 secondary to lateral disc protrusions and facet hypertrophy. Mild subarticular and mild to moderate foraminal stenosis at L4-5 secondary to acquired and congenital factors worse on the left. Mild disc bulging at L5-S1 without significant stenosis.  Lumbar radiculopathy  Lumbar facet syndrome  Sacroiliac joint dysfunction  Status post surgical intervention of the cervical region Status post surgery of the cervical region  The patient is status post C5-C7 anterior cervical fusion and C5-C6 posterior cervical fusion with bone graft material posteriorly. No hardware complications. There is straightening of the normal cervical lordosis which appears stable    PLAN  Continue present medications oxycodone and  Flexeril  F/U PCP Dr. Juanetta Gosling for evaliation of  BP and general medical  condition  F/U surgical evaluation. Patient will follow-up with Dr. Marikay Alar as discussed and review MRI results  F/U neurological evaluation May consider PNCV EMG studies and other studies  May consider radiofrequency rhizolysis or intraspinal procedures pending response to present treatment and F/U evaluation   Patient to call [facility] Management Center should patient have concerns prior to scheduled return appointment

## 2016-01-30 ENCOUNTER — Other Ambulatory Visit: Payer: Self-pay | Admitting: Pain Medicine

## 2016-02-08 ENCOUNTER — Encounter: Payer: Self-pay | Admitting: Pain Medicine

## 2016-02-08 ENCOUNTER — Ambulatory Visit: Payer: Medicare Other | Attending: Pain Medicine | Admitting: Pain Medicine

## 2016-02-08 VITALS — BP 115/75 | HR 79 | Temp 98.3°F | Resp 16 | Ht 69.0 in | Wt 180.0 lb

## 2016-02-08 DIAGNOSIS — M5136 Other intervertebral disc degeneration, lumbar region: Secondary | ICD-10-CM

## 2016-02-08 DIAGNOSIS — Z9889 Other specified postprocedural states: Secondary | ICD-10-CM | POA: Diagnosis not present

## 2016-02-08 DIAGNOSIS — M533 Sacrococcygeal disorders, not elsewhere classified: Secondary | ICD-10-CM | POA: Diagnosis not present

## 2016-02-08 DIAGNOSIS — M47817 Spondylosis without myelopathy or radiculopathy, lumbosacral region: Secondary | ICD-10-CM

## 2016-02-08 DIAGNOSIS — M48061 Spinal stenosis, lumbar region without neurogenic claudication: Secondary | ICD-10-CM

## 2016-02-08 DIAGNOSIS — M4806 Spinal stenosis, lumbar region: Secondary | ICD-10-CM | POA: Insufficient documentation

## 2016-02-08 DIAGNOSIS — M5030 Other cervical disc degeneration, unspecified cervical region: Secondary | ICD-10-CM

## 2016-02-08 DIAGNOSIS — Z981 Arthrodesis status: Secondary | ICD-10-CM | POA: Insufficient documentation

## 2016-02-08 DIAGNOSIS — M79606 Pain in leg, unspecified: Secondary | ICD-10-CM | POA: Diagnosis present

## 2016-02-08 DIAGNOSIS — M546 Pain in thoracic spine: Secondary | ICD-10-CM | POA: Diagnosis present

## 2016-02-08 DIAGNOSIS — M5116 Intervertebral disc disorders with radiculopathy, lumbar region: Secondary | ICD-10-CM | POA: Diagnosis not present

## 2016-02-08 DIAGNOSIS — M47812 Spondylosis without myelopathy or radiculopathy, cervical region: Secondary | ICD-10-CM

## 2016-02-08 DIAGNOSIS — M48062 Spinal stenosis, lumbar region with neurogenic claudication: Secondary | ICD-10-CM

## 2016-02-08 DIAGNOSIS — M542 Cervicalgia: Secondary | ICD-10-CM | POA: Diagnosis present

## 2016-02-08 MED ORDER — OXYCODONE HCL 10 MG PO TABS: ORAL_TABLET | ORAL | Status: DC

## 2016-02-08 MED ORDER — CYCLOBENZAPRINE HCL 10 MG PO TABS: ORAL_TABLET | ORAL | Status: DC

## 2016-02-08 NOTE — Progress Notes (Signed)
Subjective:    Patient ID: Mark Snow, male    DOB: 1971-02-25, 45 y.o.   MRN: [id number]  HPI The patient is a 45 year old gentleman who returns to pain management for further evaluation and treatment of pain involving the neck entire back upper and lower extremity region. The patient is status post surgical intervention of both the cervical and lumbar regions. The patient has had exacerbation of pain and has undergone evaluation with Dr. Marikay Alar neurosurgeon. The patient will follow-up with Dr. Yetta Barre for further assessment of his condition as discussed. At the present time the patient has severe pain involving the lower back lower extremity region especially the right lower extremity region. We will continue present medications and will proceed with lumbosacral selective nerve root block to be performed at time return appointment. The patient states the pain is aggravated by standing and walking becomes more intense as the day progresses. The patient states the pain is associated with numbness and tingling sensation of the lower extremity. We will consider additional modifications of treatment pending response to lumbosacral selective nerve root block and follow-up evaluation pain management center. The patient was understanding and agreement suggested treatment plan. The patient will follow-up with Dr. Marikay Alar as planned as well      Review of Systems     Objective:   Physical Exam  There was tenderness to palpation of the paraspinal muscular region cervical region cervical facet region of mild degree with well-healed surgical scar of the cervical region without increased warmth and erythema in the region of the scar. Palpation over this splenius capitis and occipitalis regions reproduce mild discomfort. There was unremarkable Spurling's maneuver. There was limited range of motion of the cervical spine. The patient appeared to be with slightly decreased grip strength and Tinel  and Phalen's maneuver were without increase of pain of significant degree. Palpation over the thoracic facet thoracic paraspinal musculature with which region was evidence of muscle spasm a moderate degree with no crepitus of the thoracic region noted. Muscle spasm of the thoracic region was more severe on the right compared to the left. Palpation over the lumbar paraspinal must reason lumbar facet region was with moderate to severe discomfort as well with severe tenderness to palpation over the lumbar facets on the right compared to the left. Extension and palpation of the lumbar facets reproduce severe discomfort. There was moderate to severe tenderness of the PSIS and PII S region as well as the gluteal and piriformis musculature region right greater than the left. Straight leg raise was limited to approximately 20 without a definite increase of pain with dorsiflexion noted. There was negative clonus negative Homans with questionable decreased sensation of the L5 dermatomal distribution. A total strength appeared to be decreased. Abdomen was nontender with no costovertebral tenderness noted.      Assessment & Plan:    Degenerative disc disease lumbar spine Laminectomy L2 and L3 with decompression of the central canal. 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 Moderate right and mild left foraminal stenosis at L3-4 secondary to lateral disc protrusions and facet hypertrophy. Mild subarticular and mild to moderate foraminal stenosis at L4-5 secondary to acquired and congenital factors worse on the left. Mild disc bulging at L5-S1 without significant stenosis.  Lumbar radiculopathy  Lumbar facet syndrome  Sacroiliac joint dysfunction  Status post surgical intervention of the cervical region Status post surgery of the cervical region  The patient is status post C5-C7 anterior cervical fusion and C5-C6 posterior  cervical fusion with bone      PLAN   Continue present  medications oxycodone and  Flexeril  Lumbosacral selective nerve root block to be performed at time of return appointment  F/U PCP Dr. Juanetta Gosling for evaliation of  BP and general medical  condition  F/U surgical evaluation. Patient will follow-up with Dr. Marikay Alar  F/U neurological evaluation May consider PNCV EMG studies and other studies  May consider radiofrequency rhizolysis or intraspinal procedures pending response to present treatment and F/U evaluation   Patient to call Pain Management Center should patient have concerns prior to scheduled return appointment

## 2016-02-08 NOTE — Patient Instructions (Addendum)
PLAN   Continue present medications oxycodone and  Flexeril  Lumbosacral selective nerve root block to be performed at time of return appointment  F/U PCP Dr. Juanetta Gosling for evaliation of  BP and general medical  condition  F/U surgical evaluation. Patient will follow-up with Dr. Marikay Alar  F/U neurological evaluation May consider PNCV EMG studies and other studies  May consider radiofrequency rhizolysis or intraspinal procedures pending response to present treatment and F/U evaluation   Patient to call Pain Management Center should patient have concerns prior to scheduled return appointment You were given scripts for Oxycodone and Flexeril today Selective Nerve Root Block Patient Information  Description: Specific nerve roots exit the spinal canal and these nerves can be compressed and inflamed by a bulging disc and bone spurs.  By injecting steroids on the nerve root, we can potentially decrease the inflammation surrounding these nerves, which often leads to decreased pain.  Also, by injecting local anesthesia on the nerve root, this can provide [place] helpful information to give to your referring doctor if it decreases your pain.  Selective nerve root blocks can be done along the spine from the neck to the low back depending on the location of your pain.   After numbing the skin with local anesthesia, a small needle is passed to the nerve root and the position of the needle is verified using x-ray pictures.  After the needle is in correct position, we then deposit the medication.  You may experience a pressure sensation while this is being done.  The entire block usually lasts less than 15 minutes.  Conditions that may be treated with selective nerve root blocks:  Low back and leg pain  Spinal stenosis  Diagnostic block prior to potential surgery  Neck and arm pain  Post laminectomy syndrome  Preparation for the injection:  1. Do not eat any solid food or dairy products within 6  hours of your appointment. 2. You may drink clear liquids up to 2 hours before an appointment.  Clear liquids include water, black coffee, juice or soda.  No milk or cream please. 3. You may take your regular medications, including pain medications, with a sip of water before your appointment.  Diabetics should hold regular insulin (if taken separately) and take 1/2 normal NPH dose the morning of the procedure.  Carry some sugar containing items with you to your appointment. 4. A driver must accompany you and be prepared to drive you home after your procedure. 5. Bring all your current medications with you. 6. An IV may be inserted and sedation may be given at the discretion of the physician. 7. A blood pressure cuff, EKG, and other monitors will often be applied during the procedure.  Some patients may need to have extra oxygen administered for a short period. 8. You will be asked to provide medical information, including allergies, prior to the procedure.  We must know immediately if you are taking blood  Thinners (like Coumadin) or if you are allergic to IV iodine contrast (dye).  Possible side-effects: All are usually temporary  Bleeding from needle site  Light headedness  Numbness and tingling  Decreased blood pressure  Weakness in arms/legs  Pressure sensation in back/neck  Pain at injection site (several days)  Possible complications: All are extremely rare  Infection  Nerve injury  Spinal headache (a headache wore with upright position)  Call if you experience:  Fever/chills associated with headache or increased back/neck pain  Headache worsened by an upright  position  New onset weakness or numbness of an extremity below the injection site  Hives or difficulty breathing (go to the emergency room)  Inflammation or drainage at the injection site(s)  Severe back/neck pain greater than usual  New symptoms which are concerning to you  Please note:  Although  the local anesthetic injected can often make your back or neck feel good for several hours after the injection the pain will likely return.  It takes 3-5 days for steroids to work on the nerve root. You may not notice any pain relief for at least one week.  If effective, we will often do a series of 3 injections spaced 3-6 weeks apart to maximally decrease your pain.    If you have any questions, please call [phone] [facility]

## 2016-02-08 NOTE — Progress Notes (Signed)
Safety precautions to be maintained throughout the outpatient stay will include: orient to surroundings, keep bed in low position, maintain call bell within reach at all times, provide assistance with transfer out of bed and ambulation.  

## 2016-02-15 ENCOUNTER — Ambulatory Visit: Payer: Medicare Other | Attending: Pain Medicine | Admitting: Pain Medicine

## 2016-02-15 ENCOUNTER — Encounter: Payer: Self-pay | Admitting: Pain Medicine

## 2016-02-15 VITALS — BP 113/68 | HR 83 | Temp 98.1°F | Resp 15 | Ht 69.0 in | Wt 185.0 lb

## 2016-02-15 DIAGNOSIS — M48061 Spinal stenosis, lumbar region without neurogenic claudication: Secondary | ICD-10-CM

## 2016-02-15 DIAGNOSIS — Z9889 Other specified postprocedural states: Secondary | ICD-10-CM | POA: Insufficient documentation

## 2016-02-15 DIAGNOSIS — M5030 Other cervical disc degeneration, unspecified cervical region: Secondary | ICD-10-CM

## 2016-02-15 DIAGNOSIS — M545 Low back pain: Secondary | ICD-10-CM | POA: Diagnosis present

## 2016-02-15 DIAGNOSIS — M79606 Pain in leg, unspecified: Secondary | ICD-10-CM | POA: Diagnosis present

## 2016-02-15 DIAGNOSIS — M5136 Other intervertebral disc degeneration, lumbar region: Secondary | ICD-10-CM | POA: Diagnosis not present

## 2016-02-15 DIAGNOSIS — M48062 Spinal stenosis, lumbar region with neurogenic claudication: Secondary | ICD-10-CM

## 2016-02-15 DIAGNOSIS — M5126 Other intervertebral disc displacement, lumbar region: Secondary | ICD-10-CM | POA: Diagnosis not present

## 2016-02-15 DIAGNOSIS — M47812 Spondylosis without myelopathy or radiculopathy, cervical region: Secondary | ICD-10-CM

## 2016-02-15 DIAGNOSIS — M47817 Spondylosis without myelopathy or radiculopathy, lumbosacral region: Secondary | ICD-10-CM

## 2016-02-15 DIAGNOSIS — M4806 Spinal stenosis, lumbar region: Secondary | ICD-10-CM | POA: Insufficient documentation

## 2016-02-15 MED ORDER — LACTATED RINGERS IV SOLN: 1000.0000 mL | INTRAVENOUS | Status: DC

## 2016-02-15 MED ORDER — ORPHENADRINE CITRATE 30 MG/ML IJ SOLN
INTRAMUSCULAR | Status: AC
  Administered 2016-02-15: 13:00:00
  Filled 2016-02-15: qty 2

## 2016-02-15 MED ORDER — BUPIVACAINE HCL (PF) 0.25 % IJ SOLN
INTRAMUSCULAR | Status: AC
  Administered 2016-02-15: 13:00:00
  Filled 2016-02-15: qty 30

## 2016-02-15 MED ORDER — TRIAMCINOLONE ACETONIDE 40 MG/ML IJ SUSP
INTRAMUSCULAR | Status: AC
  Administered 2016-02-15: 13:00:00
  Filled 2016-02-15: qty 1

## 2016-02-15 MED ORDER — FENTANYL CITRATE (PF) 100 MCG/2ML IJ SOLN
INTRAMUSCULAR | Status: AC
  Administered 2016-02-15: 100 ug via INTRAVENOUS
  Filled 2016-02-15: qty 2

## 2016-02-15 MED ORDER — MIDAZOLAM HCL 5 MG/5ML IJ SOLN
INTRAMUSCULAR | Status: AC
  Administered 2016-02-15: 5 mg via INTRAVENOUS
  Filled 2016-02-15: qty 5

## 2016-02-15 MED ORDER — CIPROFLOXACIN HCL 250 MG PO TABS: 250.0000 mg | ORAL_TABLET | Freq: Two times a day (BID) | ORAL | Status: DC

## 2016-02-15 MED ORDER — CIPROFLOXACIN IN D5W 400 MG/200ML IV SOLN
INTRAVENOUS | Status: AC
  Administered 2016-02-15: 12:00:00
  Filled 2016-02-15: qty 200

## 2016-02-15 MED ORDER — BUPIVACAINE HCL (PF) 0.25 % IJ SOLN: 30.0000 mL | Freq: Once | INTRAMUSCULAR | Status: DC

## 2016-02-15 MED ORDER — FENTANYL CITRATE (PF) 100 MCG/2ML IJ SOLN: 100.0000 ug | Freq: Once | INTRAMUSCULAR | Status: DC

## 2016-02-15 MED ORDER — TRIAMCINOLONE ACETONIDE 40 MG/ML IJ SUSP: 40.0000 mg | Freq: Once | INTRAMUSCULAR | Status: DC

## 2016-02-15 MED ORDER — ORPHENADRINE CITRATE 30 MG/ML IJ SOLN: 60.0000 mg | Freq: Once | INTRAMUSCULAR | Status: DC

## 2016-02-15 MED ORDER — MIDAZOLAM HCL 5 MG/5ML IJ SOLN: 5.0000 mg | Freq: Once | INTRAMUSCULAR | Status: DC

## 2016-02-15 MED ORDER — LIDOCAINE HCL (PF) 1 % IJ SOLN: 10.0000 mL | Freq: Once | INTRAMUSCULAR | Status: DC

## 2016-02-15 MED ORDER — CIPROFLOXACIN IN D5W 400 MG/200ML IV SOLN: 400.0000 mg | Freq: Once | INTRAVENOUS | Status: DC

## 2016-02-15 NOTE — Progress Notes (Signed)
Subjective:    Patient ID: Mark Snow, male    DOB: 1971/09/17, 45 y.o.   MRN: [id number]  HPI  .PROCEDURE PERFORMED: Lumbosacral selective nerve root block   NOTE: The patient is a 45 y.o. male who returns to [facility] Management Center for further evaluation and treatment of pain involving the lumbar and lower extremity region. Studies consisting of MRI has revealed the patient to be with evidence of degenerative disc disease lumbar spine Patient with laminectomy L2 and L3 with decompression of the central canal. 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 Moderate right and mild left foraminal stenosis at L3-4 secondary to lateral disc protrusions and facet hypertrophy. Mild subarticular and mild to moderate foraminal stenosis at L4-5 secondary to acquired and congenital factors worse on the left. Mild disc bulging at L5-S1 without significant stenosis... There is concern regarding patient's symptoms being due to lumbar radiculopathy and lumbar stenosis There is concern regarding intraspinal abnormalities contributing to the patient's symptomatology. The risks, benefits, and expectations of the procedure have been explained to the patient who was understanding and in agreement with suggested treatment plan. We will proceed with interventional treatment as discussed and as explained to the patient. The patient is understanding and in agreement with suggested treatment plan.   DESCRIPTION OF PROCEDURE: Lumbosacral selective nerve root block with IV Versed, IV fentanyl conscious sedation, EKG, blood pressure, pulse, and pulse oximetry monitoring. The procedure was performed with the patient in the prone position under fluoroscopic guidance. With the patient in the prone position, Betadine prep of proposed entry site was performed. Local anesthetic skin wheal of proposed needle entry site was prepared with 1.5% plain lidocaine with AP view of the lumbosacral spine.   PROCEDURE #1:  Needle placement at the right L 2 vertebral body: A 22 -gauge needle was inserted at the inferior border of the transverse process of the vertebral body with needle placed medial to the midline of the transverse process on AP view of the lumbosacral spine.   NEEDLE PLACEMENT AT  L3, L4, and L5  VERTEBRAL BODY LEVELS  Needle  placement was accomplished at L3, L4, and L5  vertebral body levels on the right side exactly as was accomplished at the L2  vertebral body level  and utilizing the same technique and under fluoroscopic guidance.    Needle placement was then verified on lateral view at all levels with needle tip documented to be in the posterior superior quadrant of the intervertebral foramen of  L 2, L3, L4, and L5  Following negative aspiration for heme and CSF at each level, each level was injected with 3 mL of 0.25% bupivacaine with Kenalog.   LUMBOSACRAL SELECTIVE NERVE ROOT BLOCKS THE THE  RIGHT SIDE  The procedure was performed on the right side exactly as was performed on the left side and at the same levels  Under fluoroscopic guidance and utilizing the same technique.    The patient tolerated the procedure well. A total of 10 mg of Kenalog was utilized for the procedure.   PLAN:  1. Medications: Will continue presently prescribed medications Flexeril and oxycodone. 2. The patient is to undergo follow-up evaluation with PCP Dr. Juanetta Gosling for evaluation of blood pressure and general medical condition status post procedure performed on today's visit. 3. Surgical follow-up evaluation. Patient will follow-up with Dr. Marikay Alar for neurosurgical reevaluation as discussed 4. Neurological evaluation. Has been addressed 5. May consider radiofrequency procedures, implantation type procedures and other treatment pending response to  treatment and follow-up evaluation. 6. The patient has been advise do adhere to proper body mechanics and avoid activities which may aggravate condition. The  patient has been advised to call the Pain Management Center prior to scheduled return appointment should there be significant change in the patient's condition or should the patient have other concerns regarding condition prior to scheduled return appointment.   Review of Systems     Objective:   Physical Exam        Assessment & Plan:

## 2016-02-15 NOTE — Patient Instructions (Addendum)
PLAN   Continue present medications oxycodone and  Flexeril Please get Cipro antibiotic today and begin taking Cipro antibiotic today as prescribed  F/U PCP Dr. Juanetta Gosling for evaliation of  BP and general medical  condition  F/U surgical evaluation. Patient will follow-up with Dr. Marikay Alar  F/U neurological evaluation May consider PN[facility] EMG studies and other studies  May consider radiofrequency rhizolysis or intraspinal procedures pending response to present treatment and F/U evaluation   Patient to call [facility] should patient have concerns prior to scheduled return appointmentGENERAL RISKS AND COMPLICATIONS  What are the risk, side effects and possible complications? Generally speaking, most procedures are safe.  However, with any procedure there are risks, side effects, and the possibility of complications.  The risks and complications are dependent upon the sites that are lesioned, or the type of nerve block to be performed.  The closer the procedure is to the spine, the more serious the risks are.  Great care is taken when placing the radio frequency needles, block needles or lesioning probes, but sometimes complications can occur. 1. Infection: Any time there is an injection through the skin, there is a risk of infection.  This is why sterile conditions are used for these blocks.  There are four possible types of infection. 1. Localized skin infection. 2. Central Nervous System Infection-This can be in the form of Meningitis, which can be deadly. 3. Epidural Infections-This can be in the form of an epidural abscess, which can cause pressure inside of the spine, causing compression of the spinal cord with subsequent paralysis. This would require an emergency surgery to decompress, and there are no guarantees that the patient would recover from the paralysis. 4. Discitis-This is an infection of the intervertebral discs.  It occurs in about 1% of discography procedures.  It is  difficult to treat and it may lead to surgery.        2. Pain: the needles have to go through skin and soft tissues, will cause soreness.       3. Damage to internal structures:  The nerves to be lesioned may be near blood vessels or    other nerves which can be potentially damaged.       4. Bleeding: Bleeding is more common if the patient is taking blood thinners such as  aspirin, Coumadin, Ticiid, Plavix, etc., or if he/she have some genetic predisposition  such as hemophilia. Bleeding into the spinal canal can cause compression of the spinal  cord with subsequent paralysis.  This would require an emergency surgery to  decompress and there are no guarantees that the patient would recover from the  paralysis.       5. Pneumothorax:  Puncturing of a lung is a possibility, every time a needle is introduced in  the area of the chest or upper back.  Pneumothorax refers to free air around the  collapsed lung(s), inside of the thoracic cavity (chest cavity).  Another two possible  complications related to a similar event would include: Hemothorax and Chylothorax.   These are variations of the Pneumothorax, where instead of air around the collapsed  lung(s), you may have blood or chyle, respectively.       6. Spinal headaches: They may occur with any procedures in the area of the spine.       7. Persistent CSF (Cerebro-Spinal Fluid) leakage: This is a rare problem, but may occur  with prolonged intrathecal or epidural catheters either due to the formation of  a fistulous  track or a dural tear.       8. Nerve damage: By working so close to the spinal cord, there is always a possibility of  nerve damage, which could be as serious as a permanent spinal cord injury with  paralysis.       9. Death:  Although rare, severe deadly allergic reactions known as "Anaphylactic  reaction" can occur to any of the medications used.      10. Worsening of the symptoms:  We can always make thing worse.  What are the chances of  something like this happening? Chances of any of this occuring are extremely low.  By statistics, you have more of a chance of getting killed in a motor vehicle accident: while driving to the hospital than any of the above occurring .  Nevertheless, you should be aware that they are possibilities.  In general, it is similar to taking a shower.  Everybody knows that you can slip, hit your head and get killed.  Does that mean that you should not shower again?  Nevertheless always keep in mind that statistics do not mean anything if you happen to be on the wrong side of them.  Even if a procedure has a 1 (one) in a 1,000,000 (million) chance of going wrong, it you happen to be that one..Also, keep in mind that by statistics, you have more of a chance of having something go wrong when taking medications.  Who should not have this procedure? If you are on a blood thinning medication (e.g. Coumadin, Plavix, see list of "Blood Thinners"), or if you have an active infection going on, you should not have the procedure.  If you are taking any blood thinners, please inform your physician.  How should I prepare for this procedure?  Do not eat or drink anything at least six hours prior to the procedure.  Bring a driver with you .  It cannot be a taxi.  Come accompanied by an adult that can drive you back, and that is strong enough to help you if your legs get weak or numb from the local anesthetic.  Take all of your medicines the morning of the procedure with just enough water to swallow them.  If you have diabetes, make sure that you are scheduled to have your procedure done first thing in the morning, whenever possible.  If you have diabetes, take only half of your insulin dose and notify our nurse that you have done so as soon as you arrive at the clinic.  If you are diabetic, but only take blood sugar pills (oral hypoglycemic), then do not take them on the morning of your procedure.  You may take them  after you have had the procedure.  Do not take aspirin or any aspirin-containing medications, at least eleven (11) days prior to the procedure.  They may prolong bleeding.  Wear loose fitting clothing that may be easy to take off and that you would not mind if it got stained with Betadine or blood.  Do not wear any jewelry or perfume  Remove any nail coloring.  It will interfere with some of our monitoring equipment.  NOTE: Remember that this is not meant to be interpreted as a complete list of all possible complications.  Unforeseen problems may occur.  BLOOD THINNERS The following drugs contain aspirin or other products, which can cause increased bleeding during surgery and should not be taken for 2 weeks prior to and 1  week after surgery.  If you should need take something for relief of minor pain, you may take acetaminophen which is found in Tylenol,m Datril, Anacin-3 and Panadol. It is not blood thinner. The products listed below are.  Do not take any of the products listed below in addition to any listed on your instruction sheet.  A.P.C or A.P.C with Codeine Codeine Phosphate Capsules #3 Ibuprofen Ridaura  ABC compound Congesprin Imuran rimadil  Advil Cope Indocin Robaxisal  Alka-Seltzer Effervescent Pain Reliever and Antacid Coricidin or Coricidin-D  Indomethacin Rufen  Alka-Seltzer plus Cold Medicine Cosprin Ketoprofen S-A-C Tablets  Anacin Analgesic Tablets or Capsules Coumadin Korlgesic Salflex  Anacin Extra Strength Analgesic tablets or capsules CP-2 Tablets Lanoril Salicylate  Anaprox Cuprimine Capsules Levenox Salocol  Anexsia-D Dalteparin Magan Salsalate  Anodynos Darvon compound Magnesium Salicylate Sine-off  Ansaid Dasin Capsules Magsal Sodium Salicylate  Anturane Depen Capsules Marnal Soma  APF Arthritis pain formula Dewitt's Pills Measurin Stanback  Argesic Dia-Gesic Meclofenamic Sulfinpyrazone  Arthritis Bayer Timed Release Aspirin Diclofenac Meclomen Sulindac   Arthritis pain formula Anacin Dicumarol Medipren Supac  Analgesic (Safety coated) Arthralgen Diffunasal Mefanamic Suprofen  Arthritis Strength Bufferin Dihydrocodeine Mepro Compound Suprol  Arthropan liquid Dopirydamole Methcarbomol with Aspirin Synalgos  ASA tablets/Enseals Disalcid Micrainin Tagament  Ascriptin Doan's Midol Talwin  Ascriptin A/D Dolene Mobidin Tanderil  Ascriptin Extra Strength Dolobid Moblgesic Ticlid  Ascriptin with Codeine Doloprin or Doloprin with Codeine Momentum Tolectin  Asperbuf Duoprin Mono-gesic Trendar  Aspergum Duradyne Motrin or Motrin IB Triminicin  Aspirin plain, buffered or enteric coated Durasal Myochrisine Trigesic  Aspirin Suppositories Easprin Nalfon Trillsate  Aspirin with Codeine Ecotrin Regular or Extra Strength Naprosyn Uracel  Atromid-S Efficin Naproxen Ursinus  Auranofin Capsules Elmiron Neocylate Vanquish  Axotal Emagrin Norgesic Verin  Azathioprine Empirin or Empirin with Codeine Normiflo Vitamin E  Azolid Emprazil Nuprin Voltaren  Bayer Aspirin plain, buffered or children's or timed BC Tablets or powders Encaprin Orgaran Warfarin Sodium  Buff-a-Comp Enoxaparin Orudis Zorpin  Buff-a-Comp with Codeine Equegesic Os-Cal-Gesic   Buffaprin Excedrin plain, buffered or Extra Strength Oxalid   Bufferin Arthritis Strength Feldene Oxphenbutazone   Bufferin plain or Extra Strength Feldene Capsules Oxycodone with Aspirin   Bufferin with Codeine Fenoprofen Fenoprofen Pabalate or Pabalate-SF   Buffets II Flogesic Panagesic   Buffinol plain or Extra Strength Florinal or Florinal with Codeine Panwarfarin   Buf-Tabs Flurbiprofen Penicillamine   Butalbital Compound Four-way cold tablets Penicillin   Butazolidin Fragmin Pepto-Bismol   Carbenicillin Geminisyn Percodan   Carna Arthritis Reliever Geopen Persantine   Carprofen Gold's salt Persistin   Chloramphenicol Goody's Phenylbutazone   Chloromycetin Haltrain Piroxlcam   Clmetidine heparin Plaquenil    Cllnoril Hyco-pap Ponstel   Clofibrate Hydroxy chloroquine Propoxyphen         Before stopping any of these medications, be sure to consult the physician who ordered them.  Some, such as Coumadin (Warfarin) are ordered to prevent or treat serious conditions such as "deep thrombosis", "pumonary embolisms", and other heart problems.  The amount of time that you may need off of the medication may also vary with the medication and the reason for which you were taking it.  If you are taking any of these medications, please make sure you notify your pain physician before you undergo any procedures.

## 2016-02-15 NOTE — Progress Notes (Signed)
Safety precautions to be maintained throughout the outpatient stay will include: orient to surroundings, keep bed in low position, maintain call bell within reach at all times, provide assistance with transfer out of bed and ambulation.  

## 2016-02-16 ENCOUNTER — Telehealth: Payer: Self-pay | Admitting: *Deleted

## 2016-02-16 NOTE — Telephone Encounter (Signed)
Left voicemail for patient to call our office if he has any questions or concerns from procedure on yesterday.

## 2016-03-07 ENCOUNTER — Ambulatory Visit: Payer: Medicare Other | Attending: Pain Medicine | Admitting: Pain Medicine

## 2016-03-07 ENCOUNTER — Encounter: Payer: Self-pay | Admitting: Pain Medicine

## 2016-03-07 ENCOUNTER — Other Ambulatory Visit: Payer: Self-pay | Admitting: Family Medicine

## 2016-03-07 VITALS — BP 137/91 | HR 91 | Temp 98.2°F | Resp 16 | Ht 69.0 in | Wt 185.0 lb

## 2016-03-07 DIAGNOSIS — M5116 Intervertebral disc disorders with radiculopathy, lumbar region: Secondary | ICD-10-CM | POA: Diagnosis not present

## 2016-03-07 DIAGNOSIS — Z9889 Other specified postprocedural states: Secondary | ICD-10-CM | POA: Diagnosis not present

## 2016-03-07 DIAGNOSIS — M47817 Spondylosis without myelopathy or radiculopathy, lumbosacral region: Secondary | ICD-10-CM

## 2016-03-07 DIAGNOSIS — M47812 Spondylosis without myelopathy or radiculopathy, cervical region: Secondary | ICD-10-CM

## 2016-03-07 DIAGNOSIS — M48061 Spinal stenosis, lumbar region without neurogenic claudication: Secondary | ICD-10-CM

## 2016-03-07 DIAGNOSIS — M5030 Other cervical disc degeneration, unspecified cervical region: Secondary | ICD-10-CM

## 2016-03-07 DIAGNOSIS — M4806 Spinal stenosis, lumbar region: Secondary | ICD-10-CM | POA: Diagnosis not present

## 2016-03-07 DIAGNOSIS — Z981 Arthrodesis status: Secondary | ICD-10-CM | POA: Diagnosis not present

## 2016-03-07 DIAGNOSIS — M5126 Other intervertebral disc displacement, lumbar region: Secondary | ICD-10-CM | POA: Diagnosis not present

## 2016-03-07 DIAGNOSIS — M533 Sacrococcygeal disorders, not elsewhere classified: Secondary | ICD-10-CM | POA: Diagnosis not present

## 2016-03-07 DIAGNOSIS — M545 Low back pain: Secondary | ICD-10-CM | POA: Diagnosis present

## 2016-03-07 DIAGNOSIS — M48062 Spinal stenosis, lumbar region with neurogenic claudication: Secondary | ICD-10-CM

## 2016-03-07 DIAGNOSIS — M5136 Other intervertebral disc degeneration, lumbar region: Secondary | ICD-10-CM

## 2016-03-07 DIAGNOSIS — M79606 Pain in leg, unspecified: Secondary | ICD-10-CM | POA: Diagnosis present

## 2016-03-07 MED ORDER — OXYCODONE HCL 10 MG PO TABS: ORAL_TABLET | ORAL | Status: DC

## 2016-03-07 MED ORDER — CYCLOBENZAPRINE HCL 10 MG PO TABS: ORAL_TABLET | ORAL | Status: DC

## 2016-03-07 NOTE — Progress Notes (Signed)
Subjective:    Patient ID: Mark Snow, male    DOB: [birth year], 45 y.o.   MRN: [id number]  HPI  The patient is a 45 year old gentleman who returns to pain management for further evaluation and treatment of pain involving the lower back and lower extremity region especially the right lower extremity. The patient is undergone reevaluation with Dr. Marikay Alaravid [name] and is without plans for neurosurgical intervention. We discussed patient's condition and will modify medications on today's visit and we'll increase the oxycodone. We will also consider additional medication modifications as well as interventional treatment as discussed and as explained to patient on today's visit. The patient denied any trauma change in events of daily living the call significant change in symptomatology.. We will continue present medications and will consider additional interventional treatment as well pending follow-up evaluations. The patient was with understanding and in agreement with suggested treatment plan. Dr. Yetta BarreJones recommended patient continue treatment in pain management at this time and was without plan to proceed with any surgical intervention . After evaluation of patient and consideration of patient's condition and decision was made to to continue Flexeril and oxycodone and to proceed with scheduling patient for lumbar facet, medial branch nerve, blocks to be performed at time of return appointment.       Review of Systems     Objective:   Physical Exam  There was tenderness of the splenius capitis and occipitalis musculature regions of mild degree. There was well-healed surgical scar of the cervical region without increased warmth and erythema in the region of the scar. The patient was with limited range of motion of the cervical spine and appeared to be with unremarkable Spurling's maneuver.. The patient appeared to be with slightly decreased grip strength and Tinel and Phalen's maneuver were without  increase of pain of significant degree. There was tenderness over the thoracic facet thoracic paraspinal musculature region of moderate degree with moderate muscle spasm of the thoracic region noted. Palpation over the lumbar paraspinal must reason lumbar facet region was attends to palpation of moderate to moderately severe degree with lateral bending rotation extension and palpation of the lumbar facets reproducing moderately severe discomfort. There was moderately severe tenderness of the PSIS and PII S regions. There was significant increase of pain with lateral bending and rotation. There was more significant increase of pain on the right compared to the left. Straight leg raising was decreased on the right to approximately 20 with questionably increased pain with dorsiflexion. EHL strength appeared to be decreased on the right compared to the left. DTRs were difficult to elicit. There was questionably decreased sensation of the L5 dermatomal distribution. There was negative clonus negative Homans. Abdomen was nontender with no costovertebral tenderness noted      Assessment & Plan:     Degenerative disc disease lumbar spine Laminectomy L2 and L3 with decompression of the central canal. 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 Moderate right and mild left foraminal stenosis at L3-4 secondary to lateral disc protrusions and facet hypertrophy. Mild subarticular and mild to moderate foraminal stenosis at L4-5 secondary to acquired and congenital factors worse on the left. Mild disc bulging at L5-S1 without significant stenosis.  Lumbar radiculopathy  Lumbar facet syndrome  Sacroiliac joint dysfunction  Status post surgical intervention of the cervical region Status post surgery of the cervical region  The patient is status post C5-C7 anterior cervical fusion and C5-C6 posterior cervical fusion with bone    PLAN   Continue present  medications oxycodone and   Flexeril  Lumbar facet, medial branch nerve, blocks to be performed at time return appointment  F/U PCP Dr. Juanetta Gosling for evaliation of  BP and general medical  condition  F/U surgical evaluation. Patient will follow-up with Dr. Marikay Alar as discussed. Patient recently was evaluated by Dr. Yetta Barre who decided no surgery at this time and patient is to continue treatment in pain management  F/U neurological evaluation May consider [facility] EMG studies and other studies  May consider radiofrequency rhizolysis or intraspinal procedures pending response to present treatment and F/U evaluation   Patient to call [facility] Center should patient have concerns prior to scheduled return appointment

## 2016-03-07 NOTE — Patient Instructions (Addendum)
PLAN   Continue present medications oxycodone and  Flexeril  Lumbar facet, medial branch nerve, blocks to be performed at time return appointment  F/U PCP Dr. Juanetta [name] for evaliation of  BP and general medical  condition  F/U surgical evaluation. Patient will follow-up with Dr. Marikay Alaravid [name] as discussed. Patient recently was evaluated by Dr. Yetta BarreJones who decided no surgery at this time and patient is to continue treatment in pain management  F/U neurological evaluation May consider PNCV EMG studies and other studies  May consider radiofrequency rhizolysis or intraspinal procedures pending response to present treatment and F/U evaluation   Patient to call Pain Management Center should patient have concerns prior to scheduled return appointmentPain Management Discharge Instructions  General Discharge Instructions :  If you need to reach your doctor call: Monday-Friday 8:00 am - 4:00 pm at [phone] or toll free [phone].  After clinic hours [phone] to have operator reach doctor.  Bring all of your medication bottles to all your appointments in the pain clinic.  To cancel or reschedule your appointment with Pain Management please remember to call 24 hours in advance to avoid a fee.  Refer to the educational materials which you have been given on: General Risks, I had my Procedure. Discharge Instructions, Post Sedation.  Post Procedure Instructions:  The drugs you were given will stay in your system until tomorrow, so for the next 24 hours you should not drive, make any legal decisions or drink any alcoholic beverages.  You may eat anything you prefer, but it is better to start with liquids then soups and crackers, and gradually work up to solid foods.  Please notify your doctor immediately if you have any unusual bleeding, trouble breathing or pain that is not related to your normal pain.  Depending on the type of procedure that was done, some parts of your body may feel week  and/or numb.  This usually clears up by tonight or the next day.  Walk with the use of an assistive device or accompanied by an adult for the 24 hours.  You may use ice on the affected area for the first 24 hours.  Put ice in a Ziploc bag and cover with a towel and place against area 15 minutes on 15 minutes off.  You may switch to heat after 24 hours.GENERAL RISKS AND COMPLICATIONS  What are the risk, side effects and possible complications? Generally speaking, most procedures are safe.  However, with any procedure there are risks, side effects, and the possibility of complications.  The risks and complications are dependent upon the sites that are lesioned, or the type of nerve block to be performed.  The closer the procedure is to the spine, the more serious the risks are.  Great care is taken when placing the radio frequency needles, block needles or lesioning probes, but sometimes complications can occur. 1. Infection: Any time there is an injection through the skin, there is a risk of infection.  This is why sterile conditions are used for these blocks.  There are four possible types of infection. 1. Localized skin infection. 2. Central Nervous System Infection-This can be in the form of Meningitis, which can be deadly. 3. Epidural Infections-This can be in the form of an epidural abscess, which can cause pressure inside of the spine, causing compression of the spinal cord with subsequent paralysis. This would require an emergency surgery to decompress, and there are no guarantees that the patient would recover from the paralysis. 4. Discitis-This is  an infection of the intervertebral discs.  It occurs in about 1% of discography procedures.  It is difficult to treat and it may lead to surgery.        2. Pain: the needles have to go through skin and soft tissues, will cause soreness.       3. Damage to internal structures:  The nerves to be lesioned may be near blood vessels or    other nerves  which can be potentially damaged.       4. Bleeding: Bleeding is more common if the patient is taking blood thinners such as  aspirin, Coumadin, Ticiid, Plavix, etc., or if he/she have some genetic predisposition  such as hemophilia. Bleeding into the spinal canal can cause compression of the spinal  cord with subsequent paralysis.  This would require an emergency surgery to  decompress and there are no guarantees that the patient would recover from the  paralysis.       5. Pneumothorax:  Puncturing of a lung is a possibility, every time a needle is introduced in  the area of the chest or upper back.  Pneumothorax refers to free air around the  collapsed lung(s), inside of the thoracic cavity (chest cavity).  Another two possible  complications related to a similar event would include: Hemothorax and Chylothorax.   These are variations of the Pneumothorax, where instead of air around the collapsed  lung(s), you may have blood or chyle, respectively.       6. Spinal headaches: They may occur with any procedures in the area of the spine.       7. Persistent CSF (Cerebro-Spinal Fluid) leakage: This is a rare problem, but may occur  with prolonged intrathecal or epidural catheters either due to the formation of a fistulous  track or a dural tear.       8. Nerve damage: By working so close to the spinal cord, there is always a possibility of  nerve damage, which could be as serious as a permanent spinal cord injury with  paralysis.       9. Death:  Although rare, severe deadly allergic reactions known as "Anaphylactic  reaction" can occur to any of the medications used.      10. Worsening of the symptoms:  We can always make thing worse.  What are the chances of something like this happening? Chances of any of this occuring are extremely low.  By statistics, you have more of a chance of getting killed in a motor vehicle accident: while driving to the hospital than any of the above occurring .  Nevertheless, you  should be aware that they are possibilities.  In general, it is similar to taking a shower.  Everybody knows that you can slip, hit your head and get killed.  Does that mean that you should not shower again?  Nevertheless always keep in mind that statistics do not mean anything if you happen to be on the wrong side of them.  Even if a procedure has a 1 (one) in a 1,000,000 (million) chance of going wrong, it you happen to be that one..Also, keep in mind that by statistics, you have more of a chance of having something go wrong when taking medications.  Who should not have this procedure? If you are on a blood thinning medication (e.g. Coumadin, Plavix, see list of "Blood Thinners"), or if you have an active infection going on, you should not have the procedure.  If you are taking  any blood thinners, please inform your physician.  How should I prepare for this procedure?  Do not eat or drink anything at least six hours prior to the procedure.  Bring a driver with you .  It cannot be a taxi.  Come accompanied by an adult that can drive you back, and that is strong enough to help you if your legs get weak or numb from the local anesthetic.  Take all of your medicines the morning of the procedure with just enough water to swallow them.  If you have diabetes, make sure that you are scheduled to have your procedure done first thing in the morning, whenever possible.  If you have diabetes, take only half of your insulin dose and notify our nurse that you have done so as soon as you arrive at the clinic.  If you are diabetic, but only take blood sugar pills (oral hypoglycemic), then do not take them on the morning of your procedure.  You may take them after you have had the procedure.  Do not take aspirin or any aspirin-containing medications, at least eleven (11) days prior to the procedure.  They may prolong bleeding.  Wear loose fitting clothing that may be easy to take off and that you would not  mind if it got stained with Betadine or blood.  Do not wear any jewelry or perfume  Remove any nail coloring.  It will interfere with some of our monitoring equipment.  NOTE: Remember that this is not meant to be interpreted as a complete list of all possible complications.  Unforeseen problems may occur.  BLOOD THINNERS The following drugs contain aspirin or other products, which can cause increased bleeding during surgery and should not be taken for 2 weeks prior to and 1 week after surgery.  If you should need take something for relief of minor pain, you may take acetaminophen which is found in Tylenol,m Datril, Anacin-3 and Panadol. It is not blood thinner. The products listed below are.  Do not take any of the products listed below in addition to any listed on your instruction sheet.  A.P.C or A.P.C with Codeine Codeine Phosphate Capsules #3 Ibuprofen Ridaura  ABC compound Congesprin Imuran rimadil  Advil Cope Indocin Robaxisal  Alka-Seltzer Effervescent Pain Reliever and Antacid Coricidin or Coricidin-D  Indomethacin Rufen  Alka-Seltzer plus Cold Medicine Cosprin Ketoprofen S-A-C Tablets  Anacin Analgesic Tablets or Capsules Coumadin Korlgesic Salflex  Anacin Extra Strength Analgesic tablets or capsules CP-2 Tablets Lanoril Salicylate  Anaprox Cuprimine Capsules Levenox Salocol  Anexsia-D Dalteparin Magan Salsalate  Anodynos Darvon compound Magnesium Salicylate Sine-off  Ansaid Dasin Capsules Magsal Sodium Salicylate  Anturane Depen Capsules Marnal Soma  APF Arthritis pain formula Dewitt's Pills Measurin Stanback  Argesic Dia-Gesic Meclofenamic Sulfinpyrazone  Arthritis Bayer Timed Release Aspirin Diclofenac Meclomen Sulindac  Arthritis pain formula Anacin Dicumarol Medipren Supac  Analgesic (Safety coated) Arthralgen Diffunasal Mefanamic Suprofen  Arthritis Strength Bufferin Dihydrocodeine Mepro Compound Suprol  Arthropan liquid Dopirydamole Methcarbomol with Aspirin Synalgos   ASA tablets/Enseals Disalcid Micrainin Tagament  Ascriptin Doan's Midol Talwin  Ascriptin A/D Dolene Mobidin Tanderil  Ascriptin Extra Strength Dolobid Moblgesic Ticlid  Ascriptin with Codeine Doloprin or Doloprin with Codeine Momentum Tolectin  Asperbuf Duoprin Mono-gesic Trendar  Aspergum Duradyne Motrin or Motrin IB Triminicin  Aspirin plain, buffered or enteric coated Durasal Myochrisine Trigesic  Aspirin Suppositories Easprin Nalfon Trillsate  Aspirin with Codeine Ecotrin Regular or Extra Strength Naprosyn Uracel  Atromid-S Efficin Naproxen Ursinus  Auranofin Capsules Elmiron Neocylate  Vanquish  Axotal Emagrin Norgesic Verin  Azathioprine Empirin or Empirin with Codeine Normiflo Vitamin E  Azolid Emprazil Nuprin Voltaren  Bayer Aspirin plain, buffered or children's or timed BC Tablets or powders Encaprin Orgaran Warfarin Sodium  Buff-a-Comp Enoxaparin Orudis Zorpin  Buff-a-Comp with Codeine Equegesic Os-Cal-Gesic   Buffaprin Excedrin plain, buffered or Extra Strength Oxalid   Bufferin Arthritis Strength Feldene Oxphenbutazone   Bufferin plain or Extra Strength Feldene Capsules Oxycodone with Aspirin   Bufferin with Codeine Fenoprofen Fenoprofen Pabalate or Pabalate-SF   Buffets II Flogesic Panagesic   Buffinol plain or Extra Strength Florinal or Florinal with Codeine Panwarfarin   Buf-Tabs Flurbiprofen Penicillamine   Butalbital Compound Four-way cold tablets Penicillin   Butazolidin Fragmin Pepto-Bismol   Carbenicillin Geminisyn Percodan   Carna Arthritis Reliever Geopen Persantine   Carprofen Gold's salt Persistin   Chloramphenicol Goody's Phenylbutazone   Chloromycetin Haltrain Piroxlcam   Clmetidine heparin Plaquenil   Cllnoril Hyco-pap Ponstel   Clofibrate Hydroxy chloroquine Propoxyphen         Before stopping any of these medications, be sure to consult the physician who ordered them.  Some, such as Coumadin (Warfarin) are ordered to prevent or treat serious  conditions such as "deep thrombosis", "pumonary embolisms", and other heart problems.  The amount of time that you may need off of the medication may also vary with the medication and the reason for which you were taking it.  If you are taking any of these medications, please make sure you notify your pain physician before you undergo any procedures.         Facet Joint Block, Care After Refer to this sheet in the next few weeks. These instructions provide you with information on caring for yourself after your procedure. Your health care provider may also give you more specific instructions. Your treatment has been planned according to current medical practices, but problems sometimes occur. Call your health care provider if you have any problems or questions after your procedure. HOME CARE INSTRUCTIONS  2. Keep track of the amount of pain relief you feel and how long it lasts. 3. Limit pain medicine within the first 4-6 hours after the procedure as directed by your health care provider. 4. Resume taking dietary supplements and medicines as directed by your health care provider. 5. You may resume your regular diet. 6. Do not apply heat near or over the injection site(s) for 24 hours.  7. Do not take a bath or soak in water (such as a pool or lake) for 24 hours. 8. Do not drive for 24 hours unless approved by your health care provider. 9. Avoid strenuous activity for 24 hours. 10. Remove your bandages the morning after the procedure.  11. If the injection site is tender, applying an ice pack may relieve some tenderness. To do this: 1. Put ice in a bag. 2. Place a towel between your skin and the bag. 3. Leave the ice on for 15-20 minutes, 3-4 times a day. 12. Keep follow-up appointments as directed by your health care provider. SEEK MEDICAL CARE IF:   Your pain is not controlled by your medicines.   There is drainage from the injection site.   There is significant bleeding or  swelling at the injection site.  You have diabetes and your blood sugar is above 180 mg/dL. SEEK IMMEDIATE MEDICAL CARE IF:   You develop a fever of 101F (38.3C) or greater.   You have worsening pain or swelling around the injection site.  You have red streaking around the injection site.   You develop severe pain that is not controlled by your medicines.   You develop a headache, stiff neck, nausea, or vomiting.   Your eyes become very sensitive to light.   You have weakness, paralysis, or tingling in your arms or legs that was not present before the procedure.   You develop difficulty urinating or breathing.    This information is not intended to replace advice given to you by your health care provider. Make sure you discuss any questions you have with your health care provider.   Document Released: 11/26/2012 Document Revised: 12/31/2014 Document Reviewed: 11/26/2012 Elsevier Interactive Patient Education 2016 [employer] Facet Joint Block The facet joints connect the bones of the spine (vertebrae). They make it possible for you to bend, twist, and make other movements with your spine. They also prevent you from overbending, overtwisting, and making other excessive movements.  A facet joint block is a procedure where a numbing medicine (anesthetic) is injected into a facet joint. Often, a type of anti-inflammatory medicine called a steroid is also injected. A facet joint block may be done for two reasons:  13. Diagnosis. A facet joint block may be done as a test to see whether neck or back pain is caused by a worn-down or infected facet joint. If the pain gets better after a facet joint block, it means the pain is probably coming from the facet joint. If the pain does not get better, it means the pain is probably not coming from the facet joint.  14. Therapy. A facet joint block may be done to relieve neck or back pain caused by a facet joint. A facet joint block is  only done as a therapy if the pain does not improve with medicine, exercise programs, physical therapy, and other forms of pain management. LET [facility] CARE PROVIDER KNOW ABOUT:   Any allergies you have.   All medicines you are taking, including vitamins, herbs, eyedrops, and over-the-counter medicines and creams.   Previous problems you or members of your family have had with the use of anesthetics.   Any blood disorders you have had.   Other health problems you have. RISKS AND COMPLICATIONS Generally, having a facet joint block is safe. However, as with any procedure, complications can occur. Possible complications associated with having a facet joint block include:   Bleeding.   Injury to a nerve near the injection site.   Pain at the injection site.   Weakness or numbness in areas controlled by nerves near the injection site.   Infection.   Temporary fluid retention.   Allergic reaction to anesthetics or medicines used during the procedure. BEFORE THE PROCEDURE   Follow your health care provider's instructions if you are taking dietary supplements or medicines. You may need to stop taking them or reduce your dosage.   Do not take any new dietary supplements or medicines without asking your health care provider first.   Follow your health care provider's instructions about eating and drinking before the procedure. You may need to stop eating and drinking several hours before the procedure.   Arrange to have an adult drive you home after the procedure. PROCEDURE 12. You may need to remove your clothing and dress in an open-back gown so that your health care provider can access your spine.  13. The procedure will be done while you are lying on an X-ray table. Most of the time you will be  asked to lie on your stomach, but you may be asked to lie in a different position if an injection will be made in your neck.  14. Special machines will be used to monitor  your oxygen levels, heart rate, and blood pressure.  15. If an injection will be made in your neck, an intravenous (IV) tube will be inserted into one of your veins. Fluids and medicine will flow directly into your body through the IV tube.  16. The area over the facet joint where the injection will be made will be cleaned with an antiseptic soap. The surrounding skin will be covered with sterile drapes.  17. An anesthetic will be applied to your skin to make the injection area numb. You may feel a temporary stinging or burning sensation.  18. A video X-ray machine will be used to locate the joint. A contrast dye may be injected into the facet joint area to help with locating the joint.  19. When the joint is located, an anesthetic medicine will be injected into the joint through the needle.  20. Your health care provider will ask you whether you feel pain relief. If you do feel relief, a steroid may be injected to provide pain relief for a longer period of time. If you do not feel relief or feel only partial relief, additional injections of an anesthetic may be made in other facet joints.  21. The needle will be removed, the skin will be cleansed, and bandages will be applied.  AFTER THE PROCEDURE   You will be observed for 15-30 minutes before being allowed to go home. Do not drive. Have an adult drive you or take a taxi or public transportation instead.   If you feel pain relief, the pain will return in several hours or days when the anesthetic wears off.   You may feel pain relief 2-14 days after the procedure. The amount of time this relief lasts varies from person to person.   It is normal to feel some tenderness over the injected area(s) for 2 days following the procedure.   If you have diabetes, you may have a temporary increase in blood sugar.   This information is not intended to replace advice given to you by your health care provider. Make sure you discuss any questions  you have with your health care provider.   Document Released: 05/01/2007 Document Revised: 12/31/2014 Document Reviewed: 09/29/2012 Elsevier Interactive Patient Education [employer]

## 2016-03-07 NOTE — Progress Notes (Signed)
Safety precautions to be maintained throughout the outpatient stay will include: orient to surroundings, keep bed in low position, maintain call bell within reach at all times, provide assistance with transfer out of bed and ambulation.  

## 2016-03-19 ENCOUNTER — Ambulatory Visit (INDEPENDENT_AMBULATORY_CARE_PROVIDER_SITE_OTHER): Payer: Medicare Other | Admitting: Family Medicine

## 2016-03-19 ENCOUNTER — Ambulatory Visit: Payer: Medicare Other | Admitting: Pain Medicine

## 2016-03-19 ENCOUNTER — Telehealth: Payer: Self-pay | Admitting: Pain Medicine

## 2016-03-19 ENCOUNTER — Encounter: Payer: Self-pay | Admitting: Family Medicine

## 2016-03-19 VITALS — BP 111/82 | HR 98 | Temp 98.1°F | Resp 16 | Ht 69.0 in | Wt 194.0 lb

## 2016-03-19 DIAGNOSIS — F339 Major depressive disorder, recurrent, unspecified: Secondary | ICD-10-CM

## 2016-03-19 DIAGNOSIS — E785 Hyperlipidemia, unspecified: Secondary | ICD-10-CM | POA: Insufficient documentation

## 2016-03-19 DIAGNOSIS — R7303 Prediabetes: Secondary | ICD-10-CM | POA: Insufficient documentation

## 2016-03-19 DIAGNOSIS — Z720 Tobacco use: Secondary | ICD-10-CM | POA: Insufficient documentation

## 2016-03-19 DIAGNOSIS — J069 Acute upper respiratory infection, unspecified: Secondary | ICD-10-CM

## 2016-03-19 DIAGNOSIS — G8929 Other chronic pain: Secondary | ICD-10-CM | POA: Insufficient documentation

## 2016-03-19 DIAGNOSIS — J40 Bronchitis, not specified as acute or chronic: Secondary | ICD-10-CM | POA: Diagnosis not present

## 2016-03-19 DIAGNOSIS — F324 Major depressive disorder, single episode, in partial remission: Secondary | ICD-10-CM | POA: Insufficient documentation

## 2016-03-19 MED ORDER — DM-GUAIFENESIN ER 30-600 MG PO TB12: 1.0000 | ORAL_TABLET | Freq: Two times a day (BID) | ORAL | Status: DC

## 2016-03-19 MED ORDER — PREDNISONE 20 MG PO TABS: 40.0000 mg | ORAL_TABLET | Freq: Every day | ORAL | Status: DC

## 2016-03-19 MED ORDER — ALBUTEROL SULFATE HFA 108 (90 BASE) MCG/ACT IN AERS: 2.0000 | INHALATION_SPRAY | Freq: Four times a day (QID) | RESPIRATORY_TRACT | Status: DC | PRN

## 2016-03-19 MED ORDER — BENZONATATE 100 MG PO CAPS: 100.0000 mg | ORAL_CAPSULE | Freq: Three times a day (TID) | ORAL | Status: DC | PRN

## 2016-03-19 MED ORDER — PSEUDOEPHEDRINE HCL 60 MG PO TABS: 60.0000 mg | ORAL_TABLET | Freq: Three times a day (TID) | ORAL | Status: DC | PRN

## 2016-03-19 MED ORDER — DEXTROMETHORPHAN POLISTIREX ER 30 MG/5ML PO SUER: 60.0000 mg | Freq: Every day | ORAL | Status: DC

## 2016-03-19 NOTE — Patient Instructions (Addendum)
Your symptoms are consistent with a viral upper respiratory infection. At this time there is no need for antibiotics.  If your symptoms persist for > 10 days or get better and than worsen please let me know. You may have a secondary bacterial infection.  You can use supportive care at home to help with your symptoms. I have sent Mucinex DM to your pharmacy to help break up the congestion and soothe your cough. You can takes this twice daily.  I have also sent tesslon perles to your pharmacy to help with the cough- you can take these 3 times daily as needed. Honey is a natural cough suppressant- so add it to your tea in the morning.  If you have a humidifer, set that up in your bedroom at night.   Please seek immediate medical attention if you develop shortness of breath not relieve by inhaler, chest pain/tightness, fever > 103 F or other concerning symptoms.   Avoid smoking while you are sick.

## 2016-03-19 NOTE — Telephone Encounter (Signed)
Nurses Olegario MessierKathy and  AlexShatoya Please schedule patient for Lumbar Facets  For Monday, 03/26/2016

## 2016-03-19 NOTE — Assessment & Plan Note (Signed)
Encouraged smoking cessation 

## 2016-03-19 NOTE — Progress Notes (Signed)
Subjective:    Patient ID: Mark Snow, male    DOB: [birth year], 45 y.o.   MRN: [id number]  HPI: Mark Snow is a 45 y.o. male presenting on 03/19/2016 for Cough   HPI Pt presents for cough and sinus congestion x 3 days. First symptoms was runny nose, sore throat. Congestion began Saturday. Cough is productive of sputum- yellow. Nasal drainage yellow/green. Facial pain behind the eyes. Chest feels tight when coughing. Cough is worse in the morning. No shortness of breath or chest pain. No body aches or fevers.   Home treatment: Mucinex  And Robitussion cough/cold. No decongestants.   Past Medical History  Diagnosis Date  . Back pain   . HLD (hyperlipidemia)   . MRSA (methicillin resistant staph aureus) culture positive   . Arthritis   . Chronic kidney disease   . Anxiety   . Depression   . Neck pain, chronic   . Genital warts   . Insomnia   . Back pain, chronic     Current Outpatient Prescriptions on File Prior to Visit  Medication Sig  . cyclobenzaprine (FLEXERIL) 10 MG tablet Limit 1 tablet by mouth per day or twice a day to 4 times a day if tolerated  . Oxycodone HCl 10 MG TABS Limit 4 - 8  tabs by mouth per day if tolerated ( DISCONTINUE USE OF FENTANYL PATCH AND DO NOT WHERE FENTANYL PATCH)   Current Facility-Administered Medications on File Prior to Visit  Medication  . bupivacaine (PF) (MARCAINE) 0.25 % injection 30 mL  . ciprofloxacin (CIPRO) IVPB 400 mg  . fentaNYL (SUBLIMAZE) injection 100 mcg  . lactated ringers infusion 1,000 mL  . lidocaine (PF) (XYLOCAINE) 1 % injection 10 mL  . midazolam (VERSED) 5 MG/5ML injection 5 mg  . orphenadrine (NORFLEX) injection 60 mg  . triamcinolone acetonide (KENALOG-40) injection 40 mg    Review of Systems  Constitutional: Negative for fever and chills.  HENT: Positive for congestion, sinus pressure and sore throat. Negative for postnasal drip and trouble swallowing.   Respiratory: Positive for cough and chest  tightness. Negative for shortness of breath and wheezing.   Cardiovascular: Negative for chest pain, palpitations and leg swelling.  Gastrointestinal: Negative for nausea, vomiting and abdominal pain.  Endocrine: Negative.   Genitourinary: Negative for dysuria, urgency, discharge, penile pain and testicular pain.  Musculoskeletal: Negative for back pain, joint swelling and arthralgias.  Skin: Negative.   Neurological: Negative for dizziness, weakness, numbness and headaches.  Psychiatric/Behavioral: Negative for sleep disturbance and dysphoric mood.   Per HPI unless specifically indicated above     Objective:    BP 111/82 mmHg  Pulse 98  Temp(Src) 98.1 F (36.7 C)  Resp 16  Ht  (1.753 m)  Wt 194 lb (87.998 kg)  BMI 28.64 kg/m2  SpO2 100%  Wt Readings from Last 3 Encounters:  03/19/16 194 lb (87.998 kg)  03/07/16 185 lb (83.915 kg)  02/15/16 185 lb (83.915 kg)    Physical Exam  Constitutional: He appears well-developed and well-nourished. No distress.  HENT:  Head: Normocephalic and atraumatic.  Right Ear: Hearing and tympanic membrane normal. Tympanic membrane is not erythematous and not bulging.  Left Ear: Hearing and tympanic membrane normal. Tympanic membrane is not erythematous and not bulging.  Nose: Mucosal edema and rhinorrhea present. No sinus tenderness or nasal septal hematoma. Right sinus exhibits frontal sinus tenderness. Right sinus exhibits no maxillary sinus tenderness. Left sinus exhibits frontal sinus tenderness. Left sinus  exhibits no maxillary sinus tenderness.  Mouth/Throat: Uvula is midline and mucous membranes are normal. No uvula swelling. Posterior oropharyngeal erythema present. No posterior oropharyngeal edema.  Neck: Neck supple. No Brudzinski's sign and no Kernig's sign noted.  Cardiovascular: Normal rate, regular rhythm and normal heart sounds.   Pulmonary/Chest: No accessory muscle usage. No tachypnea. No respiratory distress. He has wheezes  (expiratory) in the right middle field and the left middle field. He has no rhonchi. He has no rales. Chest wall is not dull to percussion. He exhibits no tenderness.  Lymphadenopathy:    He has no cervical adenopathy.       Assessment & Plan:   Problem List Items Addressed This Visit      Other   Current tobacco use    Encouraged smoking cessation.        Other Visit Diagnoses    Bronchitis    -  Primary    Likely viral bronchitis. Treat with albuterol and prednisone. Alarm symptoms reviewed. Return if not improving. Consider CXR for lingering symptoms.     Relevant Medications    albuterol (PROVENTIL HFA;VENTOLIN HFA) 108 (90 Base) MCG/ACT inhaler    predniSONE (DELTASONE) 20 MG tablet    Upper respiratory infection        Likely viral URI. Supportive care at home. Abx indications given. Alarm symptoms reviewed. Return if not improving.     Relevant Medications    pseudoephedrine (SUDAFED) 60 MG tablet    benzonatate (TESSALON) 100 MG capsule    dextromethorphan-guaiFENesin (MUCINEX DM) 30-600 MG 12hr tablet    dextromethorphan (DELSYM) 30 MG/5ML liquid       Meds ordered this encounter  Medications  . DISCONTD: aspirin EC 81 MG tablet    Sig: Take by mouth.  . DISCONTD: atorvastatin (LIPITOR) 20 MG tablet    Sig:   . DISCONTD: clonazePAM (KLONOPIN) 1 MG tablet    Sig: Take by mouth.  . DISCONTD: DULoxetine (CYMBALTA) 60 MG capsule    Sig:   . DISCONTD: chlorpheniramine-HYDROcodone (TUSSIONEX) 10-8 MG/5ML SUER    Sig: Take by mouth.  . pseudoephedrine (SUDAFED) 60 MG tablet    Sig: Take 1 tablet (60 mg total) by mouth every 8 (eight) hours as needed for congestion.    Dispense:  30 tablet    Refill:  0    Order Specific Question:  Supervising Provider    Answer:  Janeann Forehand [phone]  . albuterol (PROVENTIL HFA;VENTOLIN HFA) 108 (90 Base) MCG/ACT inhaler    Sig: Inhale 2 puffs into the lungs every 6 (six) hours as needed for wheezing or shortness of breath.     Dispense:  1 Inhaler    Refill:  0    Order Specific Question:  Supervising Provider    Answer:  Janeann Forehand [[id number]]  . predniSONE (DELTASONE) 20 MG tablet    Sig: Take 2 tablets (40 mg total) by mouth daily with breakfast.    Dispense:  10 tablet    Refill:  0    Order Specific Question:  Supervising Provider    Answer:  Janeann Forehand [phone]  . benzonatate (TESSALON) 100 MG capsule    Sig: Take 1 capsule (100 mg total) by mouth 3 (three) times daily as needed.    Dispense:  30 capsule    Refill:  0    Order Specific Question:  Supervising Provider    Answer:  Janeann Forehand [[id number]]  . dextromethorphan-guaiFENesin [facility]  DM) 30-600 MG 12hr tablet    Sig: Take 1 tablet by mouth 2 (two) times daily.    Dispense:  20 tablet    Refill:  0    Order Specific Question:  Supervising Provider    Answer:  Janeann ForehandHAWKINS JR, JAMES H [phone][[id number]]  . dextromethorphan (DELSYM) 30 MG/5ML liquid    Sig: Take 10 mLs (60 mg total) by mouth at bedtime.    Dispense:  89 mL    Refill:  0    Order Specific Question:  Supervising Provider    Answer:  Janeann ForehandHAWKINS JR, JAMES H [[id number]]      Follow up plan: Return if symptoms worsen or fail to improve.

## 2016-03-19 NOTE — Telephone Encounter (Signed)
Patient called and left vmail Sun at 12:27 he is sick and going to urgent care, will call to resched

## 2016-03-22 ENCOUNTER — Other Ambulatory Visit: Payer: Self-pay | Admitting: *Deleted

## 2016-03-22 ENCOUNTER — Other Ambulatory Visit: Payer: Self-pay | Admitting: Family Medicine

## 2016-03-22 ENCOUNTER — Ambulatory Visit
Admission: RE | Admit: 2016-03-22 | Discharge: 2016-03-22 | Disposition: A | Discharge status: Home / Self Care | Payer: Medicare Other | Source: Ambulatory Visit | Attending: Family Medicine | Admitting: Family Medicine

## 2016-03-22 ENCOUNTER — Telehealth: Payer: Self-pay | Admitting: Family Medicine

## 2016-03-22 DIAGNOSIS — R0989 Other specified symptoms and signs involving the circulatory and respiratory systems: Secondary | ICD-10-CM | POA: Insufficient documentation

## 2016-03-22 MED ORDER — AZITHROMYCIN 250 MG PO TABS: ORAL_TABLET | ORAL | Status: DC

## 2016-03-22 NOTE — Telephone Encounter (Signed)
Please call him to determine how much worse his symptoms are.  If he is having trouble breathing, coughing up blood tinged, or more purulent sputum, increasing fever- we may need to see him to get CXR to ensure there is no pneumonia. Thanks! AK

## 2016-03-22 NOTE — Telephone Encounter (Signed)
Patient will come in for cxr today.Mark Snow

## 2016-03-22 NOTE — Telephone Encounter (Signed)
Pt. Called states he was in office  Monday and he was worse wanted to know what he should do. Pt call back # is  [phone]

## 2016-03-26 ENCOUNTER — Encounter: Payer: Self-pay | Admitting: Pain Medicine

## 2016-03-26 ENCOUNTER — Ambulatory Visit: Payer: Medicare Other | Attending: Pain Medicine | Admitting: Pain Medicine

## 2016-03-26 VITALS — BP 125/77 | HR 89 | Temp 98.3°F | Resp 11 | Ht 69.0 in | Wt 195.0 lb

## 2016-03-26 DIAGNOSIS — M48062 Spinal stenosis, lumbar region with neurogenic claudication: Secondary | ICD-10-CM

## 2016-03-26 DIAGNOSIS — M545 Low back pain: Secondary | ICD-10-CM | POA: Diagnosis present

## 2016-03-26 DIAGNOSIS — Z9889 Other specified postprocedural states: Secondary | ICD-10-CM | POA: Diagnosis not present

## 2016-03-26 DIAGNOSIS — M48061 Spinal stenosis, lumbar region without neurogenic claudication: Secondary | ICD-10-CM

## 2016-03-26 DIAGNOSIS — M51369 Other intervertebral disc degeneration, lumbar region without mention of lumbar back pain or lower extremity pain: Secondary | ICD-10-CM

## 2016-03-26 DIAGNOSIS — M5126 Other intervertebral disc displacement, lumbar region: Secondary | ICD-10-CM | POA: Insufficient documentation

## 2016-03-26 DIAGNOSIS — M47817 Spondylosis without myelopathy or radiculopathy, lumbosacral region: Secondary | ICD-10-CM

## 2016-03-26 DIAGNOSIS — M79606 Pain in leg, unspecified: Secondary | ICD-10-CM | POA: Diagnosis present

## 2016-03-26 DIAGNOSIS — M47812 Spondylosis without myelopathy or radiculopathy, cervical region: Secondary | ICD-10-CM

## 2016-03-26 DIAGNOSIS — M5136 Other intervertebral disc degeneration, lumbar region: Secondary | ICD-10-CM | POA: Insufficient documentation

## 2016-03-26 DIAGNOSIS — L0231 Cutaneous abscess of buttock: Secondary | ICD-10-CM

## 2016-03-26 DIAGNOSIS — M4806 Spinal stenosis, lumbar region: Secondary | ICD-10-CM | POA: Diagnosis not present

## 2016-03-26 DIAGNOSIS — M5030 Other cervical disc degeneration, unspecified cervical region: Secondary | ICD-10-CM

## 2016-03-26 MED ORDER — ORPHENADRINE CITRATE 30 MG/ML IJ SOLN: 60.0000 mg | Freq: Once | INTRAMUSCULAR | Status: DC

## 2016-03-26 MED ORDER — ORPHENADRINE CITRATE 30 MG/ML IJ SOLN
INTRAMUSCULAR | Status: AC
  Administered 2016-03-26: 09:00:00
  Filled 2016-03-26: qty 2

## 2016-03-26 MED ORDER — MIDAZOLAM HCL 5 MG/5ML IJ SOLN
INTRAMUSCULAR | Status: AC
  Administered 2016-03-26: 5 mg via INTRAVENOUS
  Filled 2016-03-26: qty 5

## 2016-03-26 MED ORDER — BUPIVACAINE HCL (PF) 0.25 % IJ SOLN: 30.0000 mL | Freq: Once | INTRAMUSCULAR | Status: DC

## 2016-03-26 MED ORDER — BUPIVACAINE HCL (PF) 0.25 % IJ SOLN
INTRAMUSCULAR | Status: AC
  Administered 2016-03-26: 09:00:00
  Filled 2016-03-26: qty 30

## 2016-03-26 MED ORDER — CIPROFLOXACIN HCL 250 MG PO TABS: 250.0000 mg | ORAL_TABLET | Freq: Two times a day (BID) | ORAL | Status: DC

## 2016-03-26 MED ORDER — FENTANYL CITRATE (PF) 100 MCG/2ML IJ SOLN: 100.0000 ug | Freq: Once | INTRAMUSCULAR | Status: DC

## 2016-03-26 MED ORDER — LACTATED RINGERS IV SOLN: 1000.0000 mL | INTRAVENOUS | Status: DC

## 2016-03-26 MED ORDER — CIPROFLOXACIN IN D5W 400 MG/200ML IV SOLN
INTRAVENOUS | Status: AC
  Administered 2016-03-26: 09:00:00
  Filled 2016-03-26: qty 200

## 2016-03-26 MED ORDER — FENTANYL CITRATE (PF) 100 MCG/2ML IJ SOLN
INTRAMUSCULAR | Status: AC
Start: 2016-03-26 — End: 2016-03-26
  Administered 2016-03-26: 100 ug via INTRAVENOUS
  Filled 2016-03-26: qty 2

## 2016-03-26 MED ORDER — TRIAMCINOLONE ACETONIDE 40 MG/ML IJ SUSP
INTRAMUSCULAR | Status: AC
  Administered 2016-03-26: 09:00:00
  Filled 2016-03-26: qty 1

## 2016-03-26 MED ORDER — TRIAMCINOLONE ACETONIDE 40 MG/ML IJ SUSP: 40.0000 mg | Freq: Once | INTRAMUSCULAR | Status: DC

## 2016-03-26 MED ORDER — MIDAZOLAM HCL 5 MG/5ML IJ SOLN: 5.0000 mg | Freq: Once | INTRAMUSCULAR | Status: DC

## 2016-03-26 MED ORDER — CIPROFLOXACIN IN D5W 400 MG/200ML IV SOLN: 400.0000 mg | Freq: Once | INTRAVENOUS | Status: DC

## 2016-03-26 NOTE — Progress Notes (Signed)
Patient here for procedure d/t lower back pain which is worse on the right. Safety precautions to be maintained throughout the outpatient stay will include: orient to surroundings, keep bed in low position, maintain call bell within reach at all times, provide assistance with transfer out of bed and ambulation.

## 2016-03-26 NOTE — Patient Instructions (Addendum)
PLAN   Continue present medications oxycodone and  Flexeril Please get Cipro antibiotic today and begin taking Cipro antibiotic today as prescribed  F/U PCP Dr. Juanetta Gosling for evaliation of  BP and general medical  condition  F/U surgical evaluation. Patient will follow-up with Dr. Marikay Alar  F/U neurological evaluation May consider PN[facility] EMG studies and other studies  May consider radiofrequency rhizolysis or intraspinal procedures pending response to present treatment and F/U evaluation   Patient to call [facility] should patient have concerns prior to scheduled return appointment   General Discharge Instructions :  If you need to reach your doctor call: Monday-Friday 8:00 am - 4:00 pm at [phone] or toll free [phone].  After clinic hours [phone] to have operator reach doctor.  Bring all of your medication bottles to all your appointments in the pain clinic.  To cancel or reschedule your appointment with Pain Management please remember to call 24 hours in advance to avoid a fee.  Refer to the educational materials which you have been given on: General Risks, I had my Procedure. Discharge Instructions, Post Sedation.  Post Procedure Instructions:  The drugs you were given will stay in your system until tomorrow, so for the next 24 hours you should not drive, make any legal decisions or drink any alcoholic beverages.  You may eat anything you prefer, but it is better to start with liquids then soups and crackers, and gradually work up to solid foods.  Please notify your doctor immediately if you have any unusual bleeding, trouble breathing or pain that is not related to your normal pain.  Depending on the type of procedure that was done, some parts of your body may feel week and/or numb.  This usually clears up by tonight or the next day.  Walk with the use of an assistive device or accompanied by an adult for the 24 hours.  You may use ice on the  affected area for the first 24 hours.  Put ice in a Ziploc bag and cover with a towel and place against area 15 minutes on 15 minutes off.  You may switch to heat after 24 hours.GENERAL RISKS AND COMPLICATIONS  What are the risk, side effects and possible complications? Generally speaking, most procedures are safe.  However, with any procedure there are risks, side effects, and the possibility of complications.  The risks and complications are dependent upon the sites that are lesioned, or the type of nerve block to be performed.  The closer the procedure is to the spine, the more serious the risks are.  Great care is taken when placing the radio frequency needles, block needles or lesioning probes, but sometimes complications can occur. 1. Infection: Any time there is an injection through the skin, there is a risk of infection.  This is why sterile conditions are used for these blocks.  There are four possible types of infection. 1. Localized skin infection. 2. Central Nervous System Infection-This can be in the form of Meningitis, which can be deadly. 3. Epidural Infections-This can be in the form of an epidural abscess, which can cause pressure inside of the spine, causing compression of the spinal cord with subsequent paralysis. This would require an emergency surgery to decompress, and there are no guarantees that the patient would recover from the paralysis. 4. Discitis-This is an infection of the intervertebral discs.  It occurs in about 1% of discography procedures.  It is difficult to treat and it may lead to surgery.  2. Pain: the needles have to go through skin and soft tissues, will cause soreness.       3. Damage to internal structures:  The nerves to be lesioned may be near blood vessels or    other nerves which can be potentially damaged.       4. Bleeding: Bleeding is more common if the patient is taking blood thinners such as  aspirin, Coumadin, Ticiid, Plavix, etc., or if he/she  have some genetic predisposition  such as hemophilia. Bleeding into the spinal canal can cause compression of the spinal  cord with subsequent paralysis.  This would require an emergency surgery to  decompress and there are no guarantees that the patient would recover from the  paralysis.       5. Pneumothorax:  Puncturing of a lung is a possibility, every time a needle is introduced in  the area of the chest or upper back.  Pneumothorax refers to free air around the  collapsed lung(s), inside of the thoracic cavity (chest cavity).  Another two possible  complications related to a similar event would include: Hemothorax and Chylothorax.   These are variations of the Pneumothorax, where instead of air around the collapsed  lung(s), you may have blood or chyle, respectively.       6. Spinal headaches: They may occur with any procedures in the area of the spine.       7. Persistent CSF (Cerebro-Spinal Fluid) leakage: This is a rare problem, but may occur  with prolonged intrathecal or epidural catheters either due to the formation of a fistulous  track or a dural tear.       8. Nerve damage: By working so close to the spinal cord, there is always a possibility of  nerve damage, which could be as serious as a permanent spinal cord injury with  paralysis.       9. Death:  Although rare, severe deadly allergic reactions known as "Anaphylactic  reaction" can occur to any of the medications used.      10. Worsening of the symptoms:  We can always make thing worse.  What are the chances of something like this happening? Chances of any of this occuring are extremely low.  By statistics, you have more of a chance of getting killed in a motor vehicle accident: while driving to the hospital than any of the above occurring .  Nevertheless, you should be aware that they are possibilities.  In general, it is similar to taking a shower.  Everybody knows that you can slip, hit your head and get killed.  Does that mean that  you should not shower again?  Nevertheless always keep in mind that statistics do not mean anything if you happen to be on the wrong side of them.  Even if a procedure has a 1 (one) in a 1,000,000 (million) chance of going wrong, it you happen to be that one..Also, keep in mind that by statistics, you have more of a chance of having something go wrong when taking medications.  Who should not have this procedure? If you are on a blood thinning medication (e.g. Coumadin, Plavix, see list of "Blood Thinners"), or if you have an active infection going on, you should not have the procedure.  If you are taking any blood thinners, please inform your physician.  How should I prepare for this procedure?  Do not eat or drink anything at least six hours prior to the procedure.  Bring a driver  with you .  It cannot be a taxi.  Come accompanied by an adult that can drive you back, and that is strong enough to help you if your legs get weak or numb from the local anesthetic.  Take all of your medicines the morning of the procedure with just enough water to swallow them.  If you have diabetes, make sure that you are scheduled to have your procedure done first thing in the morning, whenever possible.  If you have diabetes, take only half of your insulin dose and notify our nurse that you have done so as soon as you arrive at the clinic.  If you are diabetic, but only take blood sugar pills (oral hypoglycemic), then do not take them on the morning of your procedure.  You may take them after you have had the procedure.  Do not take aspirin or any aspirin-containing medications, at least eleven (11) days prior to the procedure.  They may prolong bleeding.  Wear loose fitting clothing that may be easy to take off and that you would not mind if it got stained with Betadine or blood.  Do not wear any jewelry or perfume  Remove any nail coloring.  It will interfere with some of our monitoring equipment.  NOTE:  Remember that this is not meant to be interpreted as a complete list of all possible complications.  Unforeseen problems may occur.  BLOOD THINNERS The following drugs contain aspirin or other products, which can cause increased bleeding during surgery and should not be taken for 2 weeks prior to and 1 week after surgery.  If you should need take something for relief of minor pain, you may take acetaminophen which is found in Tylenol,m Datril, Anacin-3 and Panadol. It is not blood thinner. The products listed below are.  Do not take any of the products listed below in addition to any listed on your instruction sheet.  A.P.C or A.P.C with Codeine Codeine Phosphate Capsules #3 Ibuprofen Ridaura  ABC compound Congesprin Imuran rimadil  Advil Cope Indocin Robaxisal  Alka-Seltzer Effervescent Pain Reliever and Antacid Coricidin or Coricidin-D  Indomethacin Rufen  Alka-Seltzer plus Cold Medicine Cosprin Ketoprofen S-A-C Tablets  Anacin Analgesic Tablets or Capsules Coumadin Korlgesic Salflex  Anacin Extra Strength Analgesic tablets or capsules CP-2 Tablets Lanoril Salicylate  Anaprox Cuprimine Capsules Levenox Salocol  Anexsia-D Dalteparin Magan Salsalate  Anodynos Darvon compound Magnesium Salicylate Sine-off  Ansaid Dasin Capsules Magsal Sodium Salicylate  Anturane Depen Capsules Marnal Soma  APF Arthritis pain formula Dewitt's Pills Measurin Stanback  Argesic Dia-Gesic Meclofenamic Sulfinpyrazone  Arthritis Bayer Timed Release Aspirin Diclofenac Meclomen Sulindac  Arthritis pain formula Anacin Dicumarol Medipren Supac  Analgesic (Safety coated) Arthralgen Diffunasal Mefanamic Suprofen  Arthritis Strength Bufferin Dihydrocodeine Mepro Compound Suprol  Arthropan liquid Dopirydamole Methcarbomol with Aspirin Synalgos  ASA tablets/Enseals Disalcid Micrainin Tagament  Ascriptin Doan's Midol Talwin  Ascriptin A/D Dolene Mobidin Tanderil  Ascriptin Extra Strength Dolobid Moblgesic Ticlid   Ascriptin with Codeine Doloprin or Doloprin with Codeine Momentum Tolectin  Asperbuf Duoprin Mono-gesic Trendar  Aspergum Duradyne Motrin or Motrin IB Triminicin  Aspirin plain, buffered or enteric coated Durasal Myochrisine Trigesic  Aspirin Suppositories Easprin Nalfon Trillsate  Aspirin with Codeine Ecotrin Regular or Extra Strength Naprosyn Uracel  Atromid-S Efficin Naproxen Ursinus  Auranofin Capsules Elmiron Neocylate Vanquish  Axotal Emagrin Norgesic Verin  Azathioprine Empirin or Empirin with Codeine Normiflo Vitamin E  Azolid Emprazil Nuprin Voltaren  Bayer Aspirin plain, buffered or children's or timed BC Tablets or powders  Encaprin Orgaran Warfarin Sodium  Buff-a-Comp Enoxaparin Orudis Zorpin  Buff-a-Comp with Codeine Equegesic Os-Cal-Gesic   Buffaprin Excedrin plain, buffered or Extra Strength Oxalid   Bufferin Arthritis Strength Feldene Oxphenbutazone   Bufferin plain or Extra Strength Feldene Capsules Oxycodone with Aspirin   Bufferin with Codeine Fenoprofen Fenoprofen Pabalate or Pabalate-SF   Buffets II Flogesic Panagesic   Buffinol plain or Extra Strength Florinal or Florinal with Codeine Panwarfarin   Buf-Tabs Flurbiprofen Penicillamine   Butalbital Compound Four-way cold tablets Penicillin   Butazolidin Fragmin Pepto-Bismol   Carbenicillin Geminisyn Percodan   Carna Arthritis Reliever Geopen Persantine   Carprofen Gold's salt Persistin   Chloramphenicol Goody's Phenylbutazone   Chloromycetin Haltrain Piroxlcam   Clmetidine heparin Plaquenil   Cllnoril Hyco-pap Ponstel   Clofibrate Hydroxy chloroquine Propoxyphen         Before stopping any of these medications, be sure to consult the physician who ordered them.  Some, such as Coumadin (Warfarin) are ordered to prevent or treat serious conditions such as "deep thrombosis", "pumonary embolisms", and other heart problems.  The amount of time that you may need off of the medication may also vary with the medication  and the reason for which you were taking it.  If you are taking any of these medications, please make sure you notify your pain physician before you undergo any procedures.         Facet Joint Block, Care After Refer to this sheet in the next few weeks. These instructions provide you with information on caring for yourself after your procedure. Your health care provider may also give you more specific instructions. Your treatment has been planned according to current medical practices, but problems sometimes occur. Call your health care provider if you have any problems or questions after your procedure. HOME CARE INSTRUCTIONS  2. Keep track of the amount of pain relief you feel and how long it lasts. 3. Limit pain medicine within the first 4-6 hours after the procedure as directed by your health care provider. 4. Resume taking dietary supplements and medicines as directed by your health care provider. 5. You may resume your regular diet. 6. Do not apply heat near or over the injection site(s) for 24 hours.  7. Do not take a bath or soak in water (such as a pool or lake) for 24 hours. 8. Do not drive for 24 hours unless approved by your health care provider. 9. Avoid strenuous activity for 24 hours. 10. Remove your bandages the morning after the procedure.  11. If the injection site is tender, applying an ice pack may relieve some tenderness. To do this: 1. Put ice in a bag. 2. Place a towel between your skin and the bag. 3. Leave the ice on for 15-20 minutes, 3-4 times a day. 12. Keep follow-up appointments as directed by your health care provider. SEEK MEDICAL CARE IF:   Your pain is not controlled by your medicines.   There is drainage from the injection site.   There is significant bleeding or swelling at the injection site.  You have diabetes and your blood sugar is above 180 mg/dL. SEEK IMMEDIATE MEDICAL CARE IF:   You develop a fever of 101F (38.3C) or greater.    You have worsening pain or swelling around the injection site.   You have red streaking around the injection site.   You develop severe pain that is not controlled by your medicines.   You develop a headache, stiff neck, nausea, or vomiting.  Your eyes become very sensitive to light.   You have weakness, paralysis, or tingling in your arms or legs that was not present before the procedure.   You develop difficulty urinating or breathing.    This information is not intended to replace advice given to you by your health care provider. Make sure you discuss any questions you have with your health care provider.   Document Released: 11/26/2012 Document Revised: 12/31/2014 Document Reviewed: 11/26/2012 Elsevier Interactive Patient Education 2016 [employer] Facet Joint Block The facet joints connect the bones of the spine (vertebrae). They make it possible for you to bend, twist, and make other movements with your spine. They also prevent you from overbending, overtwisting, and making other excessive movements.  A facet joint block is a procedure where a numbing medicine (anesthetic) is injected into a facet joint. Often, a type of anti-inflammatory medicine called a steroid is also injected. A facet joint block may be done for two reasons:  13. Diagnosis. A facet joint block may be done as a test to see whether neck or back pain is caused by a worn-down or infected facet joint. If the pain gets better after a facet joint block, it means the pain is probably coming from the facet joint. If the pain does not get better, it means the pain is probably not coming from the facet joint.  14. Therapy. A facet joint block may be done to relieve neck or back pain caused by a facet joint. A facet joint block is only done as a therapy if the pain does not improve with medicine, exercise programs, physical therapy, and other forms of pain management. LET [facility] CARE PROVIDER KNOW ABOUT:    Any allergies you have.   All medicines you are taking, including vitamins, herbs, eyedrops, and over-the-counter medicines and creams.   Previous problems you or members of your family have had with the use of anesthetics.   Any blood disorders you have had.   Other health problems you have. RISKS AND COMPLICATIONS Generally, having a facet joint block is safe. However, as with any procedure, complications can occur. Possible complications associated with having a facet joint block include:   Bleeding.   Injury to a nerve near the injection site.   Pain at the injection site.   Weakness or numbness in areas controlled by nerves near the injection site.   Infection.   Temporary fluid retention.   Allergic reaction to anesthetics or medicines used during the procedure. BEFORE THE PROCEDURE   Follow your health care provider's instructions if you are taking dietary supplements or medicines. You may need to stop taking them or reduce your dosage.   Do not take any new dietary supplements or medicines without asking your health care provider first.   Follow your health care provider's instructions about eating and drinking before the procedure. You may need to stop eating and drinking several hours before the procedure.   Arrange to have an adult drive you home after the procedure. PROCEDURE 12. You may need to remove your clothing and dress in an open-back gown so that your health care provider can access your spine.  13. The procedure will be done while you are lying on an X-ray table. Most of the time you will be asked to lie on your stomach, but you may be asked to lie in a different position if an injection will be made in your neck.  14. Special machines will be used to  monitor your oxygen levels, heart rate, and blood pressure.  15. If an injection will be made in your neck, an intravenous (IV) tube will be inserted into one of your veins. Fluids and  medicine will flow directly into your body through the IV tube.  16. The area over the facet joint where the injection will be made will be cleaned with an antiseptic soap. The surrounding skin will be covered with sterile drapes.  17. An anesthetic will be applied to your skin to make the injection area numb. You may feel a temporary stinging or burning sensation.  18. A video X-ray machine will be used to locate the joint. A contrast dye may be injected into the facet joint area to help with locating the joint.  19. When the joint is located, an anesthetic medicine will be injected into the joint through the needle.  20. Your health care provider will ask you whether you feel pain relief. If you do feel relief, a steroid may be injected to provide pain relief for a longer period of time. If you do not feel relief or feel only partial relief, additional injections of an anesthetic may be made in other facet joints.  21. The needle will be removed, the skin will be cleansed, and bandages will be applied.  AFTER THE PROCEDURE   You will be observed for 15-30 minutes before being allowed to go home. Do not drive. Have an adult drive you or take a taxi or public transportation instead.   If you feel pain relief, the pain will return in several hours or days when the anesthetic wears off.   You may feel pain relief 2-14 days after the procedure. The amount of time this relief lasts varies from person to person.   It is normal to feel some tenderness over the injected area(s) for 2 days following the procedure.   If you have diabetes, you may have a temporary increase in blood sugar.   This information is not intended to replace advice given to you by your health care provider. Make sure you discuss any questions you have with your health care provider.   Document Released: 05/01/2007 Document Revised: 12/31/2014 Document Reviewed: 09/29/2012 Elsevier Interactive Patient Education 2016  [employer].  Please get Cipro antibiotic today and begin taking Cipro antibiotic today as prescribe GENERAL RISKS AND COMPLICATIONS  What are the risk, side effects and possible complications? Generally speaking, most procedures are safe.  However, with any procedure there are risks, side effects, and the possibility of complications.  The risks and complications are dependent upon the sites that are lesioned, or the type of nerve block to be performed.  The closer the procedure is to the spine, the more serious the risks are.  Great care is taken when placing the radio frequency needles, block needles or lesioning probes, but sometimes complications can occur. 1. Infection: Any time there is an injection through the skin, there is a risk of infection.  This is why sterile conditions are used for these blocks.  There are four possible types of infection. 1. Localized skin infection. 2. Central Nervous System Infection-This can be in the form of Meningitis, which can be deadly. 3. Epidural Infections-This can be in the form of an epidural abscess, which can cause pressure inside of the spine, causing compression of the spinal cord with subsequent paralysis. This would require an emergency surgery to decompress, and there are no guarantees that the patient would recover from the  paralysis. 4. Discitis-This is an infection of the intervertebral discs.  It occurs in about 1% of discography procedures.  It is difficult to treat and it may lead to surgery.        2. Pain: the needles have to go through skin and soft tissues, will cause soreness.       3. Damage to internal structures:  The nerves to be lesioned may be near blood vessels or    other nerves which can be potentially damaged.       4. Bleeding: Bleeding is more common if the patient is taking blood thinners such as  aspirin, Coumadin, Ticiid, Plavix, etc., or if he/she have some genetic predisposition  such as hemophilia. Bleeding into the  spinal canal can cause compression of the spinal  cord with subsequent paralysis.  This would require an emergency surgery to  decompress and there are no guarantees that the patient would recover from the  paralysis.       5. Pneumothorax:  Puncturing of a lung is a possibility, every time a needle is introduced in  the area of the chest or upper back.  Pneumothorax refers to free air around the  collapsed lung(s), inside of the thoracic cavity (chest cavity).  Another two possible  complications related to a similar event would include: Hemothorax and Chylothorax.   These are variations of the Pneumothorax, where instead of air around the collapsed  lung(s), you may have blood or chyle, respectively.       6. Spinal headaches: They may occur with any procedures in the area of the spine.       7. Persistent CSF (Cerebro-Spinal Fluid) leakage: This is a rare problem, but may occur  with prolonged intrathecal or epidural catheters either due to the formation of a fistulous  track or a dural tear.       8. Nerve damage: By working so close to the spinal cord, there is always a possibility of  nerve damage, which could be as serious as a permanent spinal cord injury with  paralysis.       9. Death:  Although rare, severe deadly allergic reactions known as "Anaphylactic  reaction" can occur to any of the medications used.      10. Worsening of the symptoms:  We can always make thing worse.  What are the chances of something like this happening? Chances of any of this occuring are extremely low.  By statistics, you have more of a chance of getting killed in a motor vehicle accident: while driving to the hospital than any of the above occurring .  Nevertheless, you should be aware that they are possibilities.  In general, it is similar to taking a shower.  Everybody knows that you can slip, hit your head and get killed.  Does that mean that you should not shower again?  Nevertheless always keep in mind that  statistics do not mean anything if you happen to be on the wrong side of them.  Even if a procedure has a 1 (one) in a 1,000,000 (million) chance of going wrong, it you happen to be that one..Also, keep in mind that by statistics, you have more of a chance of having something go wrong when taking medications.  Who should not have this procedure? If you are on a blood thinning medication (e.g. Coumadin, Plavix, see list of "Blood Thinners"), or if you have an active infection going on, you should not have the procedure.  If you are taking any blood thinners, please inform your physician.  How should I prepare for this procedure?  Do not eat or drink anything at least six hours prior to the procedure.  Bring a driver with you .  It cannot be a taxi.  Come accompanied by an adult that can drive you back, and that is strong enough to help you if your legs get weak or numb from the local anesthetic.  Take all of your medicines the morning of the procedure with just enough water to swallow them.  If you have diabetes, make sure that you are scheduled to have your procedure done first thing in the morning, whenever possible.  If you have diabetes, take only half of your insulin dose and notify our nurse that you have done so as soon as you arrive at the clinic.  If you are diabetic, but only take blood sugar pills (oral hypoglycemic), then do not take them on the morning of your procedure.  You may take them after you have had the procedure.  Do not take aspirin or any aspirin-containing medications, at least eleven (11) days prior to the procedure.  They may prolong bleeding.  Wear loose fitting clothing that may be easy to take off and that you would not mind if it got stained with Betadine or blood.  Do not wear any jewelry or perfume  Remove any nail coloring.  It will interfere with some of our monitoring equipment.  NOTE: Remember that this is not meant to be interpreted as a complete  list of all possible complications.  Unforeseen problems may occur.  BLOOD THINNERS The following drugs contain aspirin or other products, which can cause increased bleeding during surgery and should not be taken for 2 weeks prior to and 1 week after surgery.  If you should need take something for relief of minor pain, you may take acetaminophen which is found in Tylenol,m Datril, Anacin-3 and Panadol. It is not blood thinner. The products listed below are.  Do not take any of the products listed below in addition to any listed on your instruction sheet.  A.P.C or A.P.C with Codeine Codeine Phosphate Capsules #3 Ibuprofen Ridaura  ABC compound Congesprin Imuran rimadil  Advil Cope Indocin Robaxisal  Alka-Seltzer Effervescent Pain Reliever and Antacid Coricidin or Coricidin-D  Indomethacin Rufen  Alka-Seltzer plus Cold Medicine Cosprin Ketoprofen S-A-C Tablets  Anacin Analgesic Tablets or Capsules Coumadin Korlgesic Salflex  Anacin Extra Strength Analgesic tablets or capsules CP-2 Tablets Lanoril Salicylate  Anaprox Cuprimine Capsules Levenox Salocol  Anexsia-D Dalteparin Magan Salsalate  Anodynos Darvon compound Magnesium Salicylate Sine-off  Ansaid Dasin Capsules Magsal Sodium Salicylate  Anturane Depen Capsules Marnal Soma  APF Arthritis pain formula Dewitt's Pills Measurin Stanback  Argesic Dia-Gesic Meclofenamic Sulfinpyrazone  Arthritis Bayer Timed Release Aspirin Diclofenac Meclomen Sulindac  Arthritis pain formula Anacin Dicumarol Medipren Supac  Analgesic (Safety coated) Arthralgen Diffunasal Mefanamic Suprofen  Arthritis Strength Bufferin Dihydrocodeine Mepro Compound Suprol  Arthropan liquid Dopirydamole Methcarbomol with Aspirin Synalgos  ASA tablets/Enseals Disalcid Micrainin Tagament  Ascriptin Doan's Midol Talwin  Ascriptin A/D Dolene Mobidin Tanderil  Ascriptin Extra Strength Dolobid Moblgesic Ticlid  Ascriptin with Codeine Doloprin or Doloprin with Codeine Momentum  Tolectin  Asperbuf Duoprin Mono-gesic Trendar  Aspergum Duradyne Motrin or Motrin IB Triminicin  Aspirin plain, buffered or enteric coated Durasal Myochrisine Trigesic  Aspirin Suppositories Easprin Nalfon Trillsate  Aspirin with Codeine Ecotrin Regular or Extra Strength Naprosyn Uracel  Atromid-S Efficin Naproxen Ursinus  Auranofin Capsules Elmiron Neocylate Vanquish  Axotal Emagrin Norgesic Verin  Azathioprine Empirin or Empirin with Codeine Normiflo Vitamin E  Azolid Emprazil Nuprin Voltaren  Bayer Aspirin plain, buffered or children's or timed BC Tablets or powders Encaprin Orgaran Warfarin Sodium  Buff-a-Comp Enoxaparin Orudis Zorpin  Buff-a-Comp with Codeine Equegesic Os-Cal-Gesic   Buffaprin Excedrin plain, buffered or Extra Strength Oxalid   Bufferin Arthritis Strength Feldene Oxphenbutazone   Bufferin plain or Extra Strength Feldene Capsules Oxycodone with Aspirin   Bufferin with Codeine Fenoprofen Fenoprofen Pabalate or Pabalate-SF   Buffets II Flogesic Panagesic   Buffinol plain or Extra Strength Florinal or Florinal with Codeine Panwarfarin   Buf-Tabs Flurbiprofen Penicillamine   Butalbital Compound Four-way cold tablets Penicillin   Butazolidin Fragmin Pepto-Bismol   Carbenicillin Geminisyn Percodan   Carna Arthritis Reliever Geopen Persantine   Carprofen Gold's salt Persistin   Chloramphenicol Goody's Phenylbutazone   Chloromycetin Haltrain Piroxlcam   Clmetidine heparin Plaquenil   Cllnoril Hyco-pap Ponstel   Clofibrate Hydroxy chloroquine Propoxyphen         Before stopping any of these medications, be sure to consult the physician who ordered them.  Some, such as Coumadin (Warfarin) are ordered to prevent or treat serious conditions such as "deep thrombosis", "pumonary embolisms", and other heart problems.  The amount of time that you may need off of the medication may also vary with the medication and the reason for which you were taking it.  If you are taking any  of these medications, please make sure you notify your pain physician before you undergo any procedures.

## 2016-03-26 NOTE — Progress Notes (Signed)
Subjective:    Patient ID: Mark Snow, male    DOB: 28-Feb-1971, 45 y.o.   MRN: [id number]  HPI  PROCEDURE PERFORMED: Lumbar facet (medial branch block)   NOTE: The patient is a 45 y.o. male who [facility] for further evaluation and treatment of pain involving the lumbar and lower extremity region. MRI  revealed the patient to be with evidence of  degenerative disc disease lumbar spine Laminectomy L2 and L3 with decompression of the central canal. 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 Moderate right and mild left foraminal stenosis at L3-4 secondary to lateral disc protrusions and facet hypertrophy. Mild subarticular and mild to moderate foraminal stenosis at L4-5 secondary to acquired and congenital factors worse on the left. Mild disc bulging at L5-S1 without significant stenosis.. There is concern regarding significant component of patient's pain began due to lumbar facet syndrome. The risks, benefits, and expectations of the procedure have been discussed and explained to the patient who was understanding and in agreement with suggested treatment plan. We will proceed with interventional treatment as discussed and as explained to the patient who was understanding and wished to proceed with procedure as planned.   DESCRIPTION OF PROCEDURE: Lumbar facet (medial branch block) with IV Versed, IV fentanyl conscious sedation, EKG, blood pressure, pulse, and pulse oximetry monitoring. The procedure was performed with the patient in the prone position. Betadine prep of proposed entry site performed.   NEEDLE PLACEMENT AT: Left L 2 lumbar facet (medial branch block). Under fluoroscopic guidance with oblique orientation of 15 degrees, a 22-gauge needle was inserted at the L 2 vertebral body level with needle placed at the targeted area of Burton's Eye or Eye of the Scotty Dog with documentation of needle placement in the superior and lateral border of  targeted area of Burton's Eye or Eye of the Scotty Dog with oblique orientation of 15 degrees. Following documentation of needle placement at the L 2 vertebral body level, needle placement was then accomplished at the L 3 vertebral body level.   NEEDLE PLACEMENT AT L3, L4, and L5 VERTEBRAL BODY LEVELS ON THE LEFT SIDE The procedure was performed at the L3, L4, and L5 vertebral body levels exactly as was performed at the L 2 vertebral body level utilizing the same technique and under fluoroscopic guidance.  NEEDLE PLACEMENT AT THE SACRAL ALA with AP view of the lumbosacral spine. With the patient in the prone position, Betadine prep of proposed entry site accomplished, a 22 gauge needle was inserted in the region of the sacral ala (groove formed by the superior articulating process of S1 and the sacral wing). Following documentation of needle placement at the sacral ala,  Needle placement was then verified at all levels on lateral view. Following documentation of needle placement at all levels on lateral view and following negative aspiration for heme and CSF, each level was injected with 1 mL of 0.25% bupivacaine with Kenalog.     LUMBAR FACET, MEDIAL BRANCH NERVE, BLOCKS PERFORMED ON THE RIGHT SIDE   The procedure was performed on the right side exactly as was performed on the left side at the same levels and utilizing the same technique under fluoroscopic guidance.   Myoneural block injections of the lumbar paraspinal musculature region Following Betadine prep of proposed entry site a 22-gauge needle was inserted in the lumbar musculature region and following negative aspiration 2 cc of 0.25% bupivacaine with Norflex was injected for myoneural block injection of the lumbar  paraspinal musculature region 4     The patient tolerated the procedure well.   A total of 40 mg of Kenalog was utilized for the procedure.   PLAN:  1. Medications: The patient will continue presently prescribed  medications Flexeril and oxycodone 2. May consider modification of treatment regimen at time of return appointment pending response to treatment rendered on today's visit. 3. The patient is to follow-up with primary care physician Dr. Juanetta [name]  for further evaluation of blood pressure and general medical condition status post steroid injection performed on today's visit. 4. Surgical follow-up evaluation. Patient will follow-up with Dr. Marikay Alaravid [name] as discussed 5. Neurological follow-up evaluation.. May consider [facility] EMG studies and other studies 6. The patient may be candidate for radiofrequency procedures, implantation type procedures, and other treatment pending response to treatment and follow-up evaluation. 7. The patient has been advised to call the [facility] prior to scheduled return appointment should there be significant change in condition or should patient have other concerns regarding condition prior to scheduled return appointment.  The patient is understanding and in agreement with suggested treatment plan.   Review of Systems     Objective:   Physical Exam        Assessment & Plan:

## 2016-03-27 ENCOUNTER — Telehealth: Payer: Self-pay | Admitting: *Deleted

## 2016-03-27 NOTE — Telephone Encounter (Signed)
Spoke with patient verbalizes no complications from procedure on yesterday.  

## 2016-04-04 ENCOUNTER — Encounter: Payer: Self-pay | Admitting: Pain Medicine

## 2016-04-04 ENCOUNTER — Ambulatory Visit: Payer: Medicare Other | Attending: Pain Medicine | Admitting: Pain Medicine

## 2016-04-04 VITALS — BP 136/93 | HR 99 | Temp 98.0°F | Resp 16 | Ht 69.0 in | Wt 195.0 lb

## 2016-04-04 DIAGNOSIS — M4806 Spinal stenosis, lumbar region: Secondary | ICD-10-CM | POA: Diagnosis not present

## 2016-04-04 DIAGNOSIS — M5136 Other intervertebral disc degeneration, lumbar region: Secondary | ICD-10-CM

## 2016-04-04 DIAGNOSIS — M48061 Spinal stenosis, lumbar region without neurogenic claudication: Secondary | ICD-10-CM

## 2016-04-04 DIAGNOSIS — Z9889 Other specified postprocedural states: Secondary | ICD-10-CM | POA: Diagnosis not present

## 2016-04-04 DIAGNOSIS — M5116 Intervertebral disc disorders with radiculopathy, lumbar region: Secondary | ICD-10-CM | POA: Insufficient documentation

## 2016-04-04 DIAGNOSIS — Z981 Arthrodesis status: Secondary | ICD-10-CM | POA: Insufficient documentation

## 2016-04-04 DIAGNOSIS — M51369 Other intervertebral disc degeneration, lumbar region without mention of lumbar back pain or lower extremity pain: Secondary | ICD-10-CM

## 2016-04-04 DIAGNOSIS — M47812 Spondylosis without myelopathy or radiculopathy, cervical region: Secondary | ICD-10-CM

## 2016-04-04 DIAGNOSIS — M545 Low back pain: Secondary | ICD-10-CM | POA: Diagnosis present

## 2016-04-04 DIAGNOSIS — M533 Sacrococcygeal disorders, not elsewhere classified: Secondary | ICD-10-CM

## 2016-04-04 DIAGNOSIS — M48062 Spinal stenosis, lumbar region with neurogenic claudication: Secondary | ICD-10-CM

## 2016-04-04 DIAGNOSIS — M47817 Spondylosis without myelopathy or radiculopathy, lumbosacral region: Secondary | ICD-10-CM

## 2016-04-04 DIAGNOSIS — M5126 Other intervertebral disc displacement, lumbar region: Secondary | ICD-10-CM | POA: Insufficient documentation

## 2016-04-04 DIAGNOSIS — M79606 Pain in leg, unspecified: Secondary | ICD-10-CM | POA: Diagnosis present

## 2016-04-04 DIAGNOSIS — M5030 Other cervical disc degeneration, unspecified cervical region: Secondary | ICD-10-CM

## 2016-04-04 MED ORDER — OXYCODONE HCL 10 MG PO TABS: ORAL_TABLET | ORAL | Status: DC

## 2016-04-04 MED ORDER — CYCLOBENZAPRINE HCL 10 MG PO TABS
ORAL_TABLET | ORAL | Status: DC
Start: 2016-04-04 — End: 2016-05-02

## 2016-04-04 NOTE — Progress Notes (Signed)
Subjective:    Patient ID: Mark LeffKenneth S Martinez, male    DOB: 02-27-1971, 45 y.o.   MRN: [id number]  HPI   The patient is a 45 year old gentleman who returns to pain management for further evaluation and treatment of pain involving the lower back and lower extremity region. The patient has undergone reevaluation with Dr. Marikay Alaravid [name] neurosurgeon and has review recent imaging studies of the spine with Dr. Yetta BarreJones at the present time patient has been recommended to continue treatment in pain management Center. The patient states that the pain is aggravated by standing walking and radiates from the lumbar region toward the buttocks on the right especially. The patient is with history of prior surgical intervention of the cervical region with pain of the cervical and upper extremity regions fairly well-controlled at this time. The patient continues to have significantly disabling pain and wishes to proceed with interventional treatment at time of return appointment in attempt to decrease severity of symptoms, minimize progression of symptoms, and avoid the need for more involved treatment. We will proceed with block of nerves to the sacroiliac joint to be performed at time of return appointment as discussed and as explained to patient. The patient will continue oxycodone and Flexeril as prescribed. All agreed to suggested treatment plan   Review of Systems     Objective:   Physical Exam  There was tenderness to palpation of the splenius capitis and occipitalis musculature regions of mild degree with mild tenderness over the cervical facet cervical paraspinal musculature region. There was well-healed scar of the cervical region without increased warmth and erythema in the region of the scar. Palpation of the acromioclavicular and glenohumeral joint regions were without increased pain of significant degree and patient appeared to be with bilaterally equal grip strength. Tinel and Phalen's maneuver were  without increased pain of significant degree. Palpation over the thoracic region thoracic facet region was attends to palpation with moderate muscle spasms involving the lower thoracic region and especially on the right. There was no crepitus of the thoracic region noted. Palpation over the lumbar paraspinal musculature region lumbar facet region was associated with moderate severe discomfort with lateral bending rotation extension and palpation of the lumbar facets reproducing moderately severe discomfort. Palpation over the lumbar paraspinal must reason lumbar facet region was with more significant tenderness to palpation on the right compared to the left. Palpation over the PSIS and PII S region reproduced severe pain on the right compared to the left. Straight leg raising was limited to 20 without a definite increased pain with dorsiflexion noted. EHL strength was decreased on the right compared to the left. There was question decreased sensation of the L5 dermatomal distribution. There was negative clonus negative Homans. Abdomen nontender and no costovertebral tenderness noted       Assessment & Plan:    Sacroiliac joint dysfunction  Degenerative disc disease lumbar spine Laminectomy L2 and L3 with decompression of the central canal. 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 Moderate right and mild left foraminal stenosis at L3-4 secondary to lateral disc protrusions and facet hypertrophy. Mild subarticular and mild to moderate foraminal stenosis at L4-5 secondary to acquired and congenital factors worse on the left. Mild disc bulging at L5-S1 without significant stenosis.  Lumbar radiculopathy  Lumbar facet syndrome  Sacroiliac joint dysfunction  Status post surgical intervention of the cervical region Status post surgery of the cervical region  The patient is status post C5-C7 anterior cervical fusion and C5-C6 posterior cervical fusion  with bone     PLAN    Continue present medications oxycodone and  Flexeril  Block of nerves to the sacroiliac joint to be performed at time of return appointment  F/U PCP Dr. Juanetta Gosling for evaliation of  BP and general medical  condition  F/U surgical evaluation. Patient will follow-up with Dr. Marikay Alar as discussed. Patient recently was evaluated by Dr. Yetta Barre who decided no surgery at this time and patient is to continue treatment in pain management  F/U neurological evaluation May consider [facility] EMG studies and other studies  May consider radiofrequency rhizolysis or intraspinal procedures pending response to present treatment and F/U evaluation   Patient to call [facility] should patient have concerns prior to scheduled return appointment

## 2016-04-04 NOTE — Progress Notes (Signed)
Safety precautions to be maintained throughout the outpatient stay will include: orient to surroundings, keep bed in low position, maintain call bell within reach at all times, provide assistance with transfer out of bed and ambulation.  

## 2016-04-04 NOTE — Patient Instructions (Addendum)
PLAN   Continue present medications oxycodone and  Flexeril  Block of nerves to the sacroiliac joint to be performed at time of return appointment  F/U PCP Dr. Juanetta Gosling for evaliation of  BP and general medical  condition  F/U surgical evaluation. Patient will follow-up with Dr. Marikay Alar as discussed. Patient recently was evaluated by Dr. Yetta Barre who decided no surgery at this time and patient is to continue treatment in pain management  F/U neurological evaluation May consider PNCV EMG studies and other studies  May consider radiofrequency rhizolysis or intraspinal procedures pending response to present treatment and F/U evaluation   Patient to call [facility] Management Center should patient have concerns prior to scheduled return appointmentCeliac Plexus Block Patient Information  Description: The celiac plexus is a group of nerves which are part of the sympathetic nervous system.  These nerves supply organs in the abdomen and pelvis.  Specific organs supplied with sensation by the celiac plexus include the stomach, liver, gallbladder, pancreas, kidneys and part of the gut.   The celiac plexus is located on both sides of the aorta at approximately the level of the first lumbar vertebral body.  The block will be performed with you lying on your abdomen with a pillow underneath.  Using direct x-ray guidance, the celiac plexus will be located on both sides of the spine.  Numbing medicine will be used to deaden the skin prior to needle insertion.  In most cases, a small amount of sedation can be given by IV prior to the numbing medicine.  Two small needles will be place near the celiac plexus and local anesthetic and steroid will be injected.  The entire block usually last about 15-25 minutes.  Conditions which may be treated by celiac plexus block:   Acute and chronic pancreatitis  Pain from liver or pancreatic cancer  Pain from Crohn's disease  Other types of abdominal or flank  pain  Preparation for the injection:  1. Do not eat any solid food or dairy products within 8 hours of your appointment. 2. You may drink clear liquids up to 3 hours before appointment.  Clear liquids include water, black coffee, juice or soda.  No milk or cream please. 3. You may take your regular medication, including pain medications, with a sip of water before your appointment.  Diabetics should hold regular insulin (if taken separately) and take 1/2 normal NPH dose in the morning of the procedure.  Carry some sugar containing items with you to your appointment. 4. A driver must accompany you and be prepared to drive you home after your procedure. 5. Bring all your current medications with you. 6. An IV may be inserted and sedation may be given at the discretion of the physician. 7. A blood pressure cuff, EKG, and other monitors will often be applied during the procedure.  Some patients may need to have extra oxygen administered for a short period. 8. You will be asked to provide medical information, including your allergies and medications, prior to the procedure.  We must know immediately if you are taking blood thinners (like Coumadin/Warfarin) or if you are allergic to IV iodine contrast (dye).  We must know if you could possible be pregnant.  Possible side-effects:   Bleeding from needle site or deeper  Infection (rarre, can require surgery)  Nerve injury (rare)  Numbness & tingling (temporary)  Collapsed lung (rare)  Spinal headache ( a headache worse with upright posture)  Light-headedness (temporary)  Pain at injection site (  several days)  Decreased blood pressure (temporary)  Weakness in legs (temporary)  Seizure or other drug reaction (rare)  Call if you experience:   Fever/chills associated with headache or increased back/neck pain  Headache worsened by an upright position  New onset weakness or numbness of an extremity below the injection site.  Hives or  difficulty breathing (go to the emergency room)  Inflammation or drainage at the injection site.  New onset diarrhea lasting more than 2 weeks.  New symptoms which are concerning to you  Please note:  If effective, we will often do a series of 2-3 injections spaced 3-6 weeks apart to maximally decrease your pain.  If initial series is effective, you may be a candidate for a more permanent block of the celiac plexus. .  If you have questions, please call [phone] [facility]    GENERAL RISKS AND COMPLICATIONS  What are the risk, side effects and possible complications? Generally speaking, most procedures are safe.  However, with any procedure there are risks, side effects, and the possibility of complications.  The risks and complications are dependent upon the sites that are lesioned, or the type of nerve block to be performed.  The closer the procedure is to the spine, the more serious the risks are.  Great care is taken when placing the radio frequency needles, block needles or lesioning probes, but sometimes complications can occur. 1. Infection: Any time there is an injection through the skin, there is a risk of infection.  This is why sterile conditions are used for these blocks.  There are four possible types of infection. 1. Localized skin infection. 2. Central Nervous System Infection-This can be in the form of Meningitis, which can be deadly. 3. Epidural Infections-This can be in the form of an epidural abscess, which can cause pressure inside of the spine, causing compression of the spinal cord with subsequent paralysis. This would require an emergency surgery to decompress, and there are no guarantees that the patient would recover from the paralysis. 4. Discitis-This is an infection of the intervertebral discs.  It occurs in about 1% of discography procedures.  It is difficult to treat and it may lead to surgery.        2. Pain: the  needles have to go through skin and soft tissues, will cause soreness.       3. Damage to internal structures:  The nerves to be lesioned may be near blood vessels or    other nerves which can be potentially damaged.       4. Bleeding: Bleeding is more common if the patient is taking blood thinners such as  aspirin, Coumadin, Ticiid, Plavix, etc., or if he/she have some genetic predisposition  such as hemophilia. Bleeding into the spinal canal can cause compression of the spinal  cord with subsequent paralysis.  This would require an emergency surgery to  decompress and there are no guarantees that the patient would recover from the  paralysis.       5. Pneumothorax:  Puncturing of a lung is a possibility, every time a needle is introduced in  the area of the chest or upper back.  Pneumothorax refers to free air around the  collapsed lung(s), inside of the thoracic cavity (chest cavity).  Another two possible  complications related to a similar event would include: Hemothorax and Chylothorax.   These are variations of the Pneumothorax, where instead of air around the collapsed  lung(s), you  may have blood or chyle, respectively.       6. Spinal headaches: They may occur with any procedures in the area of the spine.       7. Persistent CSF (Cerebro-Spinal Fluid) leakage: This is a rare problem, but may occur  with prolonged intrathecal or epidural catheters either due to the formation of a fistulous  track or a dural tear.       8. Nerve damage: By working so close to the spinal cord, there is always a possibility of  nerve damage, which could be as serious as a permanent spinal cord injury with  paralysis.       9. Death:  Although rare, severe deadly allergic reactions known as "Anaphylactic  reaction" can occur to any of the medications used.      10. Worsening of the symptoms:  We can always make thing worse.  What are the chances of something like this happening? Chances of any of this occuring are  extremely low.  By statistics, you have more of a chance of getting killed in a motor vehicle accident: while driving to the hospital than any of the above occurring .  Nevertheless, you should be aware that they are possibilities.  In general, it is similar to taking a shower.  Everybody knows that you can slip, hit your head and get killed.  Does that mean that you should not shower again?  Nevertheless always keep in mind that statistics do not mean anything if you happen to be on the wrong side of them.  Even if a procedure has a 1 (one) in a 1,000,000 (million) chance of going wrong, it you happen to be that one..Also, keep in mind that by statistics, you have more of a chance of having something go wrong when taking medications.  Who should not have this procedure? If you are on a blood thinning medication (e.g. Coumadin, Plavix, see list of "Blood Thinners"), or if you have an active infection going on, you should not have the procedure.  If you are taking any blood thinners, please inform your physician.  How should I prepare for this procedure?  Do not eat or drink anything at least six hours prior to the procedure.  Bring a driver with you .  It cannot be a taxi.  Come accompanied by an adult that can drive you back, and that is strong enough to help you if your legs get weak or numb from the local anesthetic.  Take all of your medicines the morning of the procedure with just enough water to swallow them.  If you have diabetes, make sure that you are scheduled to have your procedure done first thing in the morning, whenever possible.  If you have diabetes, take only half of your insulin dose and notify our nurse that you have done so as soon as you arrive at the clinic.  If you are diabetic, but only take blood sugar pills (oral hypoglycemic), then do not take them on the morning of your procedure.  You may take them after you have had the procedure.  Do not take aspirin or any  aspirin-containing medications, at least eleven (11) days prior to the procedure.  They may prolong bleeding.  Wear loose fitting clothing that may be easy to take off and that you would not mind if it got stained with Betadine or blood.  Do not wear any jewelry or perfume  Remove any nail coloring.  It will interfere  with some of our monitoring equipment.  NOTE: Remember that this is not meant to be interpreted as a complete list of all possible complications.  Unforeseen problems may occur.  BLOOD THINNERS The following drugs contain aspirin or other products, which can cause increased bleeding during surgery and should not be taken for 2 weeks prior to and 1 week after surgery.  If you should need take something for relief of minor pain, you may take acetaminophen which is found in Tylenol,m Datril, Anacin-3 and Panadol. It is not blood thinner. The products listed below are.  Do not take any of the products listed below in addition to any listed on your instruction sheet.  A.P.C or A.P.C with Codeine Codeine Phosphate Capsules #3 Ibuprofen Ridaura  ABC compound Congesprin Imuran rimadil  Advil Cope Indocin Robaxisal  Alka-Seltzer Effervescent Pain Reliever and Antacid Coricidin or Coricidin-D  Indomethacin Rufen  Alka-Seltzer plus Cold Medicine Cosprin Ketoprofen S-A-C Tablets  Anacin Analgesic Tablets or Capsules Coumadin Korlgesic Salflex  Anacin Extra Strength Analgesic tablets or capsules CP-2 Tablets Lanoril Salicylate  Anaprox Cuprimine Capsules Levenox Salocol  Anexsia-D Dalteparin Magan Salsalate  Anodynos Darvon compound Magnesium Salicylate Sine-off  Ansaid Dasin Capsules Magsal Sodium Salicylate  Anturane Depen Capsules Marnal Soma  APF Arthritis pain formula Dewitt's Pills Measurin Stanback  Argesic Dia-Gesic Meclofenamic Sulfinpyrazone  Arthritis Bayer Timed Release Aspirin Diclofenac Meclomen Sulindac  Arthritis pain formula Anacin Dicumarol Medipren Supac  Analgesic  (Safety coated) Arthralgen Diffunasal Mefanamic Suprofen  Arthritis Strength Bufferin Dihydrocodeine Mepro Compound Suprol  Arthropan liquid Dopirydamole Methcarbomol with Aspirin Synalgos  ASA tablets/Enseals Disalcid Micrainin Tagament  Ascriptin Doan's Midol Talwin  Ascriptin A/D Dolene Mobidin Tanderil  Ascriptin Extra Strength Dolobid Moblgesic Ticlid  Ascriptin with Codeine Doloprin or Doloprin with Codeine Momentum Tolectin  Asperbuf Duoprin Mono-gesic Trendar  Aspergum Duradyne Motrin or Motrin IB Triminicin  Aspirin plain, buffered or enteric coated Durasal Myochrisine Trigesic  Aspirin Suppositories Easprin Nalfon Trillsate  Aspirin with Codeine Ecotrin Regular or Extra Strength Naprosyn Uracel  Atromid-S Efficin Naproxen Ursinus  Auranofin Capsules Elmiron Neocylate Vanquish  Axotal Emagrin Norgesic Verin  Azathioprine Empirin or Empirin with Codeine Normiflo Vitamin E  Azolid Emprazil Nuprin Voltaren  Bayer Aspirin plain, buffered or children's or timed BC Tablets or powders Encaprin Orgaran Warfarin Sodium  Buff-a-Comp Enoxaparin Orudis Zorpin  Buff-a-Comp with Codeine Equegesic Os-Cal-Gesic   Buffaprin Excedrin plain, buffered or Extra Strength Oxalid   Bufferin Arthritis Strength Feldene Oxphenbutazone   Bufferin plain or Extra Strength Feldene Capsules Oxycodone with Aspirin   Bufferin with Codeine Fenoprofen Fenoprofen Pabalate or Pabalate-SF   Buffets II Flogesic Panagesic   Buffinol plain or Extra Strength Florinal or Florinal with Codeine Panwarfarin   Buf-Tabs Flurbiprofen Penicillamine   Butalbital Compound Four-way cold tablets Penicillin   Butazolidin Fragmin Pepto-Bismol   Carbenicillin Geminisyn Percodan   Carna Arthritis Reliever Geopen Persantine   Carprofen Gold's salt Persistin   Chloramphenicol Goody's Phenylbutazone   Chloromycetin Haltrain Piroxlcam   Clmetidine heparin Plaquenil   Cllnoril Hyco-pap Ponstel   Clofibrate Hydroxy chloroquine  Propoxyphen         Before stopping any of these medications, be sure to consult the physician who ordered them.  Some, such as Coumadin (Warfarin) are ordered to prevent or treat serious conditions such as "deep thrombosis", "pumonary embolisms", and other heart problems.  The amount of time that you may need off of the medication may also vary with the medication and the reason for which you were taking it.  If you are taking any of these medications, please make sure you notify your pain physician before you undergo any procedures.

## 2016-04-16 ENCOUNTER — Ambulatory Visit: Payer: Medicare Other | Admitting: Pain Medicine

## 2016-04-16 ENCOUNTER — Telehealth: Payer: Self-pay | Admitting: Pain Medicine

## 2016-04-16 NOTE — Telephone Encounter (Signed)
Sick with vomiting, needs to resched appt

## 2016-04-25 ENCOUNTER — Ambulatory Visit: Payer: Medicare Other | Attending: Pain Medicine | Admitting: Pain Medicine

## 2016-04-25 ENCOUNTER — Encounter: Payer: Self-pay | Admitting: Pain Medicine

## 2016-04-25 VITALS — BP 125/78 | HR 94 | Temp 97.8°F | Resp 16 | Ht 69.0 in | Wt 195.0 lb

## 2016-04-25 DIAGNOSIS — M791 Myalgia: Secondary | ICD-10-CM | POA: Diagnosis present

## 2016-04-25 DIAGNOSIS — M48062 Spinal stenosis, lumbar region with neurogenic claudication: Secondary | ICD-10-CM

## 2016-04-25 DIAGNOSIS — M5126 Other intervertebral disc displacement, lumbar region: Secondary | ICD-10-CM | POA: Insufficient documentation

## 2016-04-25 DIAGNOSIS — M4806 Spinal stenosis, lumbar region: Secondary | ICD-10-CM | POA: Diagnosis not present

## 2016-04-25 DIAGNOSIS — M5136 Other intervertebral disc degeneration, lumbar region: Secondary | ICD-10-CM | POA: Diagnosis not present

## 2016-04-25 DIAGNOSIS — M533 Sacrococcygeal disorders, not elsewhere classified: Secondary | ICD-10-CM

## 2016-04-25 DIAGNOSIS — M47812 Spondylosis without myelopathy or radiculopathy, cervical region: Secondary | ICD-10-CM

## 2016-04-25 DIAGNOSIS — M47817 Spondylosis without myelopathy or radiculopathy, lumbosacral region: Secondary | ICD-10-CM

## 2016-04-25 DIAGNOSIS — L0231 Cutaneous abscess of buttock: Secondary | ICD-10-CM

## 2016-04-25 DIAGNOSIS — Z9889 Other specified postprocedural states: Secondary | ICD-10-CM | POA: Insufficient documentation

## 2016-04-25 DIAGNOSIS — M5030 Other cervical disc degeneration, unspecified cervical region: Secondary | ICD-10-CM

## 2016-04-25 DIAGNOSIS — M79606 Pain in leg, unspecified: Secondary | ICD-10-CM | POA: Diagnosis present

## 2016-04-25 DIAGNOSIS — M545 Low back pain: Secondary | ICD-10-CM | POA: Diagnosis present

## 2016-04-25 DIAGNOSIS — M48061 Spinal stenosis, lumbar region without neurogenic claudication: Secondary | ICD-10-CM

## 2016-04-25 MED ORDER — BUPIVACAINE HCL (PF) 0.25 % IJ SOLN
30.0000 mL | Freq: Once | INTRAMUSCULAR | Status: AC
  Administered 2016-04-25: 30 mL

## 2016-04-25 MED ORDER — MIDAZOLAM HCL 5 MG/5ML IJ SOLN
INTRAMUSCULAR | Status: AC
  Administered 2016-04-25: 5 mg via INTRAVENOUS
  Filled 2016-04-25: qty 5

## 2016-04-25 MED ORDER — ORPHENADRINE CITRATE 30 MG/ML IJ SOLN: 60.0000 mg | Freq: Once | INTRAMUSCULAR | Status: DC

## 2016-04-25 MED ORDER — MIDAZOLAM HCL 5 MG/5ML IJ SOLN
5.0000 mg | Freq: Once | INTRAMUSCULAR | Status: AC
  Administered 2016-04-25: 5 mg via INTRAVENOUS

## 2016-04-25 MED ORDER — TRIAMCINOLONE ACETONIDE 40 MG/ML IJ SUSP
40.0000 mg | Freq: Once | INTRAMUSCULAR | Status: AC
  Administered 2016-04-25: 10:00:00

## 2016-04-25 MED ORDER — BUPIVACAINE HCL (PF) 0.25 % IJ SOLN
INTRAMUSCULAR | Status: AC
  Administered 2016-04-25: 30 mL
  Filled 2016-04-25: qty 30

## 2016-04-25 MED ORDER — CIPROFLOXACIN IN D5W 400 MG/200ML IV SOLN
400.0000 mg | Freq: Once | INTRAVENOUS | Status: AC
  Administered 2016-04-25: 400 mg via INTRAVENOUS

## 2016-04-25 MED ORDER — FENTANYL CITRATE (PF) 100 MCG/2ML IJ SOLN
INTRAMUSCULAR | Status: AC
  Filled 2016-04-25: qty 2

## 2016-04-25 MED ORDER — TRIAMCINOLONE ACETONIDE 40 MG/ML IJ SUSP
INTRAMUSCULAR | Status: AC
  Filled 2016-04-25: qty 1

## 2016-04-25 MED ORDER — CIPROFLOXACIN HCL 250 MG PO TABS: 250.0000 mg | ORAL_TABLET | Freq: Two times a day (BID) | ORAL | Status: DC

## 2016-04-25 MED ORDER — ORPHENADRINE CITRATE 30 MG/ML IJ SOLN
INTRAMUSCULAR | Status: AC
  Filled 2016-04-25: qty 2

## 2016-04-25 MED ORDER — FENTANYL CITRATE (PF) 100 MCG/2ML IJ SOLN
100.0000 ug | Freq: Once | INTRAMUSCULAR | Status: AC
  Administered 2016-04-25: 10:00:00 via INTRAVENOUS

## 2016-04-25 MED ORDER — LACTATED RINGERS IV SOLN: 1000.0000 mL | INTRAVENOUS | Status: DC

## 2016-04-25 MED ORDER — CIPROFLOXACIN IN D5W 400 MG/200ML IV SOLN
INTRAVENOUS | Status: AC
  Administered 2016-04-25: 400 mg via INTRAVENOUS
  Filled 2016-04-25: qty 200

## 2016-04-25 NOTE — Progress Notes (Signed)
Safety precautions to be maintained throughout the outpatient stay will include: orient to surroundings, keep bed in low position, maintain call bell within reach at all times, provide assistance with transfer out of bed and ambulation.  

## 2016-04-25 NOTE — Progress Notes (Signed)
Subjective:    Patient ID: Mark Snow, male    DOB: [birth year], 45 y.o.   MRN: [id number]  HPI  PROCEDURE:  Block of nerves to the sacroiliac joint.   NOTE:  The patient is a 45 y.o. male who returns to the [facility] for further evaluation and treatment of pain involving the lower back and lower extremity region with pain in the region of the buttocks as well. Prior MRI studies reveal degenerative disc disease lumbar spine Laminectomy L2 and L3 with decompression of the central canal. 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 Moderate right and mild left foraminal stenosis at L3-4 secondary to lateral disc protrusions and facet hypertrophy. Mild subarticular and mild to moderate foraminal stenosis at L4-5 secondary to acquired and congenital factors worse on the left. Mild disc bulging at L5-S1 without significant stenosis.. The patient is with reproduction of severe disabling pain with palpation over the PSIS and PII S regions and is with positive Patrick's maneuver    There is concern regarding a significant component of the patient's pain being due to sacroiliac joint dysfunction The risks, benefits, expectations of the procedure have been discussed and explained to the patient who is understanding and willing to proceed with interventional treatment in attempt to decrease severity of patient's symptoms, minimize the risk of medication escalation and  hopefully retard the progression of the patient's symptoms. We will proceed with what is felt to be a medically necessary procedure, block of nerves to the sacroiliac joint.   DESCRIPTION OF PROCEDURE:  Block of nerves to the sacroiliac joint.   The patient was taken to the fluoroscopy suite. With the patient in the prone position with EKG, blood pressure, pulse, capnography, and pulse oximetry monitoring, IV Versed, IV fentanyl conscious sedation, Betadine prep of proposed entry site was performed.   Block of  nerves at the L5 vertebral body level.   With the patient in prone position, under fluoroscopic guidance, a 22 -gauge needle was inserted at the L5 vertebral body level on the left side. With 15 degrees oblique orientation a 22 -gauge needle was inserted in the region known as Burton's eye or eye of the Scotty dog. Following documentation of needle placement in the area of Burton's eye or eye of the Scotty dog under fluoroscopic guidance, needle placement was then accomplished at the sacral ala level on the left side.   Needle placement at the sacral ala.   With the patient in prone position under fluoroscopic guidance with AP view of the lumbosacral spine, a 22 -gauge needle was inserted in the region known as the sacral ala on the left side. Following documentation of needle placement on the left side under fluoroscopic guidance needle placement was then accomplished at the S1 foramen level.   Needle placement at the S1 foramen level.   With the patient in prone position under fluoroscopic guidance with AP view of the lumbosacral spine and cephalad orientation, a 22 -gauge needle was inserted at the superior and lateral border of the S1 foramen on the left side. Following documentation of needle placement at the S1 foramen level on the left side, needle placement was then accomplished at the S2 foramen level on the left side.   Needle placement at the S2 foramen level.   With the patient in prone position with AP view of the lumbosacral spine with cephalad orientation, a 22 - gauge needle was inserted at the superior and lateral border of the S2 foramen  under fluoroscopic guidance on the left side. Following needle placement at the L5 vertebral body level, sacral ala, S1 foramen and S2 foramen on the left side, needle placement was verified on lateral view under fluoroscopic guidance.  Following needle placement documentation on lateral view, each needle was injected with 1 mL of 0.25% bupivacaine  and Kenalog.   BLOCK OF THE NERVES TO SACROILIAC JOINT ON THE RIGHT SIDE The procedure was performed on the right side at the same levels as was performed on the left side and utilizing the same technique as on the left side and was performed under fluoroscopic guidance as on the left side   A total of  of Kenalog was utilized for the procedure.   PLAN:  1. Medications: The patient will continue presently prescribed medications Flexeril and oxycodone 2. The patient will be considered for modification of treatment regimen pending response to the procedure performed on today's visit.  3. The patient is to follow-up with primary care physician Dr. Juanetta Gosling for evaluation of blood pressure and general medical condition following the procedure performed on today's visit.  4. Surgical evaluation as discussed. . Patient will follow-up with Dr. Lovell Sheehan as discussed and as planned 5. Neurological evaluation as discussed. . May consider PNCV EMG studies and other studies 6. The patient may be a candidate for radiofrequency procedures, implantation devices and other treatment pending response to treatment performed on today's visit and follow-up evaluation.  7. The patient has been advised to adhere to proper body mechanics and to avoid activities which may exacerbate the patient's symptoms.   Return appointment to [facility] as scheduled.    Review of Systems     Objective:   Physical Exam        Assessment & Plan:

## 2016-04-25 NOTE — Patient Instructions (Addendum)
PLAN   Continue present medications oxycodone and  Flexeril Please get Cipro antibiotic today and begin taking Cipro antibiotic today as prescribed  F/U PCP Dr. Juanetta [name] for evaliation of  BP and general medical  condition  F/U surgical evaluation. Patient will follow-up with Dr. Marikay Alaravid [name]  F/U neurological evaluation May consider PNCV EMG studies and other studies  May consider radiofrequency rhizolysis or intraspinal procedures pending response to present treatment and F/U evaluation   Patient to call Pain Management Center should patient have concerns prior to scheduled return appointmentPain Management Discharge Instructions  General Discharge Instructions :  If you need to reach your doctor call: Monday-Friday 8:00 am - 4:00 pm at [phone] or toll free [phone].  After clinic hours [phone] to have operator reach doctor.  Bring all of your medication bottles to all your appointments in the pain clinic.  To cancel or reschedule your appointment with Pain Management please remember to call 24 hours in advance to avoid a fee.  Refer to the educational materials which you have been given on: General Risks, I had my Procedure. Discharge Instructions, Post Sedation.  Post Procedure Instructions:  The drugs you were given will stay in your system until tomorrow, so for the next 24 hours you should not drive, make any legal decisions or drink any alcoholic beverages.  You may eat anything you prefer, but it is better to start with liquids then soups and crackers, and gradually work up to solid foods.  Please notify your doctor immediately if you have any unusual bleeding, trouble breathing or pain that is not related to your normal pain.  Depending on the type of procedure that was done, some parts of your body may feel week and/or numb.  This usually clears up by tonight or the next day.  Walk with the use of an assistive device or accompanied by an adult for the 24  hours.  You may use ice on the affected area for the first 24 hours.  Put ice in a Ziploc bag and cover with a towel and place against area 15 minutes on 15 minutes off.  You may switch to heat after 24 hours.

## 2016-04-26 ENCOUNTER — Telehealth: Payer: Self-pay

## 2016-04-26 NOTE — Telephone Encounter (Signed)
Post procedure phone call.  States he is sore.  Instructed to put heat on it.

## 2016-05-02 ENCOUNTER — Ambulatory Visit: Payer: Medicare Other | Attending: Pain Medicine | Admitting: Pain Medicine

## 2016-05-02 ENCOUNTER — Encounter: Payer: Self-pay | Admitting: Pain Medicine

## 2016-05-02 VITALS — BP 128/83 | HR 90 | Temp 97.8°F | Resp 16 | Ht 69.0 in | Wt 195.0 lb

## 2016-05-02 DIAGNOSIS — M545 Low back pain: Secondary | ICD-10-CM | POA: Diagnosis present

## 2016-05-02 DIAGNOSIS — M5116 Intervertebral disc disorders with radiculopathy, lumbar region: Secondary | ICD-10-CM | POA: Diagnosis not present

## 2016-05-02 DIAGNOSIS — Z9889 Other specified postprocedural states: Secondary | ICD-10-CM | POA: Diagnosis not present

## 2016-05-02 DIAGNOSIS — Z981 Arthrodesis status: Secondary | ICD-10-CM | POA: Insufficient documentation

## 2016-05-02 DIAGNOSIS — M533 Sacrococcygeal disorders, not elsewhere classified: Secondary | ICD-10-CM | POA: Diagnosis not present

## 2016-05-02 DIAGNOSIS — M4806 Spinal stenosis, lumbar region: Secondary | ICD-10-CM | POA: Diagnosis not present

## 2016-05-02 DIAGNOSIS — M5136 Other intervertebral disc degeneration, lumbar region: Secondary | ICD-10-CM

## 2016-05-02 DIAGNOSIS — M25551 Pain in right hip: Secondary | ICD-10-CM | POA: Diagnosis not present

## 2016-05-02 DIAGNOSIS — M1610 Unilateral primary osteoarthritis, unspecified hip: Secondary | ICD-10-CM | POA: Diagnosis not present

## 2016-05-02 DIAGNOSIS — M48061 Spinal stenosis, lumbar region without neurogenic claudication: Secondary | ICD-10-CM

## 2016-05-02 DIAGNOSIS — M47817 Spondylosis without myelopathy or radiculopathy, lumbosacral region: Secondary | ICD-10-CM

## 2016-05-02 DIAGNOSIS — M5030 Other cervical disc degeneration, unspecified cervical region: Secondary | ICD-10-CM

## 2016-05-02 DIAGNOSIS — M48062 Spinal stenosis, lumbar region with neurogenic claudication: Secondary | ICD-10-CM

## 2016-05-02 DIAGNOSIS — M79606 Pain in leg, unspecified: Secondary | ICD-10-CM | POA: Diagnosis present

## 2016-05-02 DIAGNOSIS — M47812 Spondylosis without myelopathy or radiculopathy, cervical region: Secondary | ICD-10-CM

## 2016-05-02 DIAGNOSIS — M51369 Other intervertebral disc degeneration, lumbar region without mention of lumbar back pain or lower extremity pain: Secondary | ICD-10-CM

## 2016-05-02 DIAGNOSIS — M5126 Other intervertebral disc displacement, lumbar region: Secondary | ICD-10-CM | POA: Insufficient documentation

## 2016-05-02 MED ORDER — CYCLOBENZAPRINE HCL 10 MG PO TABS: ORAL_TABLET | ORAL | Status: DC

## 2016-05-02 MED ORDER — OXYCODONE HCL 10 MG PO TABS: ORAL_TABLET | ORAL | Status: DC

## 2016-05-02 NOTE — Progress Notes (Signed)
Safety precautions to be maintained throughout the outpatient stay will include: orient to surroundings, keep bed in low position, maintain call bell within reach at all times, provide assistance with transfer out of bed and ambulation.  

## 2016-05-02 NOTE — Progress Notes (Signed)
Subjective:    Patient ID: Mark Snow, male    DOB: 01/16/1971, 45 y.o.   MRN: [id number]  HPI  The patient is a 45 year old gentleman who returns to pain management for further evaluation and treatment of pain involving the lower back and lower extremity region. The patient's pain radiates to the right lower extremity region especially. The pain involves the region of the right hip to significant degree. The patient is status post trauma to the area and is with significant pain of the hip. We have discussed patient's condition and will proceed with femoral nerve block and obturator nerve blocks at time return appointment in attempt to reliev  right hip pain. The patient was with understanding and agreement suggested treatment plan. The patient will continue Flexeril and oxycodone as prescribed and will follow up with surgeon Dr. Marikay Alar as discussed. All agreed to suggested treatment plan. Review of Systems     Objective:   Physical Exam  There was mild tenderness of the splenius capitis and occipitalis musculature region with mild tenderness of the cervical facet cervical paraspinal musculature region a mild tenderness of the thoracic facet thoracic paraspinal musculature region that the lower thoracic region being with moderate spasm. There was no crepitus of the thoracic region noted. Palpation of the acromioclavicular and glenohumeral joint regions reproduced pain of moderate degree with moderate muscle spasm with unremarkable Spurling's maneuver. Tinel and Phalen's maneuver were without increased pain of significant degree and patient appeared to be with bilaterally equal grip strength. Palpation over the lumbar paraspinal musculatures and lumbar facet region was with moderate tenderness to palpation with lateral bending rotation extension and palpation of the lumbar facets reproducing moderate discomfort. There was moderate to moderately severe tenderness to palpation of the PSIS  and PII S region and positive Patrick's maneuver was present reproducing severe pain is well. EHL strength was decreased. There was questionably decreased sensation of the L5 dermatomal distribution with negative clonus negative Homans. Abdomen nontender and no costovertebral tenderness noted      Assessment & Plan:    Degenerative joint disease of hip  Sacroiliac joint dysfunction  Degenerative disc disease lumbar spine Laminectomy L2 and L3 with decompression of the central canal. 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 Moderate right and mild left foraminal stenosis at L3-4 secondary to lateral disc protrusions and facet hypertrophy. Mild subarticular and mild to moderate foraminal stenosis at L4-5 secondary to acquired and congenital factors worse on the left. Mild disc bulging at L5-S1 without significant stenosis.  Lumbar radiculopathy  Lumbar facet syndrome  Sacroiliac joint dysfunction  Status post surgical intervention of the cervical region Status post surgery of the cervical region  The patient is status post C5-C7 anterior cervical fusion and C5-C6 posterior cervical fusion with bone       PLAN   Continue present medications oxycodone and  Flexeril  Femoral and obturator nerve blocks to be performed at time of return appointment for pain involving right hip  F/U PCP Dr. Juanetta Gosling for evaliation of  BP and general medical  condition  F/U surgical evaluation. Patient will follow-up with Dr. Marikay Alar as discussed. Patient recently was evaluated by Dr. Yetta Barre who decided no surgery at this time and patient is to continue treatment in pain management  F/U neurological evaluation May consider PNCV EMG studies and other studies  May consider radiofrequency rhizolysis or intraspinal procedures pending response to present treatment and F/U evaluation   Patient to call Pain Management Center  should patient have concerns prior to scheduled return  appointment

## 2016-05-02 NOTE — Patient Instructions (Addendum)
PLAN   Continue present medications oxycodone and  Flexeril  Femoral and obturator nerve blocks to be performed at time of return appointment for pain involving right hip  F/U PCP Dr. Juanetta [name] for evaliation of  BP and general medical  condition  F/U surgical evaluation. Patient will follow-up with Dr. Marikay Alaravid [name] as discussed. Patient recently was evaluated by Dr. Yetta BarreJones who decided no surgery at this time and patient is to continue treatment in pain management  F/U neurological evaluation May consider PNCV EMG studies and other studies  May consider radiofrequency rhizolysis or intraspinal procedures pending response to present treatment and F/U evaluation   Patient to call Pain Management Center should patient have concerns prior to scheduled return appointment

## 2016-05-12 LAB — TOXASSURE SELECT 13 (MW), URINE

## 2016-05-14 NOTE — Progress Notes (Signed)
Quick Note:  Reviewed. ______ 

## 2016-05-16 ENCOUNTER — Ambulatory Visit: Payer: Medicare Other | Attending: Pain Medicine | Admitting: Pain Medicine

## 2016-05-16 ENCOUNTER — Encounter: Payer: Self-pay | Admitting: Pain Medicine

## 2016-05-16 VITALS — BP 112/80 | HR 93 | Temp 97.3°F | Resp 16 | Ht 69.0 in | Wt 195.0 lb

## 2016-05-16 DIAGNOSIS — M48061 Spinal stenosis, lumbar region without neurogenic claudication: Secondary | ICD-10-CM

## 2016-05-16 DIAGNOSIS — M79604 Pain in right leg: Secondary | ICD-10-CM | POA: Diagnosis not present

## 2016-05-16 DIAGNOSIS — Z9889 Other specified postprocedural states: Secondary | ICD-10-CM | POA: Diagnosis not present

## 2016-05-16 DIAGNOSIS — M545 Low back pain: Secondary | ICD-10-CM | POA: Diagnosis present

## 2016-05-16 DIAGNOSIS — M51369 Other intervertebral disc degeneration, lumbar region without mention of lumbar back pain or lower extremity pain: Secondary | ICD-10-CM

## 2016-05-16 DIAGNOSIS — M48062 Spinal stenosis, lumbar region with neurogenic claudication: Secondary | ICD-10-CM

## 2016-05-16 DIAGNOSIS — M47812 Spondylosis without myelopathy or radiculopathy, cervical region: Secondary | ICD-10-CM

## 2016-05-16 DIAGNOSIS — M5030 Other cervical disc degeneration, unspecified cervical region: Secondary | ICD-10-CM

## 2016-05-16 DIAGNOSIS — M533 Sacrococcygeal disorders, not elsewhere classified: Secondary | ICD-10-CM

## 2016-05-16 DIAGNOSIS — M5136 Other intervertebral disc degeneration, lumbar region: Secondary | ICD-10-CM

## 2016-05-16 DIAGNOSIS — M47817 Spondylosis without myelopathy or radiculopathy, lumbosacral region: Secondary | ICD-10-CM

## 2016-05-16 MED ORDER — CIPROFLOXACIN HCL 250 MG PO TABS: 250.0000 mg | ORAL_TABLET | Freq: Two times a day (BID) | ORAL | Status: DC

## 2016-05-16 MED ORDER — FENTANYL CITRATE (PF) 100 MCG/2ML IJ SOLN
100.0000 ug | Freq: Once | INTRAMUSCULAR | Status: AC
  Administered 2016-05-16: 100 ug via INTRAVENOUS
  Filled 2016-05-16: qty 2

## 2016-05-16 MED ORDER — LACTATED RINGERS IV SOLN
1000.0000 mL | INTRAVENOUS | Status: DC
  Administered 2016-05-16: 1000 mL via INTRAVENOUS

## 2016-05-16 MED ORDER — MIDAZOLAM HCL 5 MG/5ML IJ SOLN
5.0000 mg | Freq: Once | INTRAMUSCULAR | Status: AC
  Administered 2016-05-16: 5 mg via INTRAVENOUS
  Filled 2016-05-16: qty 5

## 2016-05-16 MED ORDER — LIDOCAINE HCL (PF) 1 % IJ SOLN
10.0000 mL | Freq: Once | INTRAMUSCULAR | Status: AC
  Administered 2016-05-16: 10 mL via SUBCUTANEOUS
  Filled 2016-05-16: qty 10

## 2016-05-16 MED ORDER — ORPHENADRINE CITRATE 30 MG/ML IJ SOLN
60.0000 mg | Freq: Once | INTRAMUSCULAR | Status: AC
  Administered 2016-05-16: 60 mg via INTRAMUSCULAR
  Filled 2016-05-16: qty 2

## 2016-05-16 MED ORDER — BUPIVACAINE HCL (PF) 0.25 % IJ SOLN
30.0000 mL | Freq: Once | INTRAMUSCULAR | Status: AC
  Administered 2016-05-16: 30 mL
  Filled 2016-05-16: qty 30

## 2016-05-16 MED ORDER — TRIAMCINOLONE ACETONIDE 40 MG/ML IJ SUSP
40.0000 mg | Freq: Once | INTRAMUSCULAR | Status: AC
  Administered 2016-05-16: 40 mg
  Filled 2016-05-16: qty 1

## 2016-05-16 MED ORDER — CIPROFLOXACIN IN D5W 400 MG/200ML IV SOLN
400.0000 mg | Freq: Once | INTRAVENOUS | Status: AC
  Administered 2016-05-16: 400 mg via INTRAVENOUS
  Filled 2016-05-16: qty 200

## 2016-05-16 NOTE — Progress Notes (Signed)
   Subjective:    Patient ID: Mark Snow, male    DOB: 05/22/1971, 45 y.o.   MRN: [id number]  HPI                                       RIGHT HIP INJECTION   The patient is a 45 year old gentleman who returns to pain management for further evaluation and treatment of pain involving the lower back lower extremity region with prior surgical intervention of the lumbar region and with severe pain involving the lower back and right lower extremity region. The patient is undergone neurosurgical reevaluation without recommendations for additional surgery of the lumbosacral spine. The patient is with positive Patrick's maneuver and is with severe tenderness to palpation in the region of the PSIS and PII S regions. There is concern regarding patient being with significant degenerative changes of the right hip contributing to patient's symptomatology. The risks benefits and expectations of the procedure have been discussed and explained to patient who was understanding and wished to proceed with interventional treatment of the right hip at this time. All agreed to suggested treatment plan    Description Of Procedure:  Right Hip Injection  The patient was taken to fluoroscopy suite and assumed the supine position. EKG, blood pressure, pulse, pulse oximetry anD capnography monitoring were all in place. IV Versed and IV fentanyl conscious sedation was accomplished. Betadine prep of proposed entry site was accomplished. Identification of landmarks for the procedure right hip injection was accomplished. Under fluoroscopic guidance a 22-gauge needle was inserted in the region of the right hip in the region of the neck of the femur with negative heme return. Following fluoroscopic documentation of accurate needle placement a total of 2 cc of 0.25% bupivacaine with Kenalog was injected for right hip injection. The needle was removed.   The patient tolerated the procedure well  A total of 20 mg of Kenalog was  utilized for the procedure      PLAN    Continue present medications oxycodone and  Flexeril  Please obtain Cipro antibiotic and begin taking Cipro antibiotic today as prescribed  F/U PCP Mark Snow for evaliation of  BP and general medical  condition  F/U surgical evaluation. Patient will follow-up with Mark Snow as discussed. Patient recently was evaluated by Mark Snow who decided no surgery at this time and patient is to continue treatment in pain management Orthopedic evaluation to be considered as discussed  F/U neurological evaluation May consider PNCV EMG studies and other studies  May consider radiofrequency rhizolysis or intraspinal procedures pending response to present treatment and F/U evaluation   Patient to call Pain Management Center should patient have concerns prior to scheduled return appointment   Review of Systems     Objective:   Physical Exam        Assessment & Plan:

## 2016-05-16 NOTE — Patient Instructions (Addendum)
PLAN   Continue present medications oxycodone and  Flexeril  Please obtain Cipro antibiotic and begin taking Cipro antibiotic today as prescribed  F/U PCP Dr. Juanetta Gosling for evaliation of  BP and general medical  condition  F/U surgical evaluation. Patient will follow-up with Dr. Marikay Alar as discussed. Patient recently was evaluated by Dr. Yetta Barre who decided no surgery at this time and patient is to continue treatment in pain management Orthopedic evaluation to be considered as discussed  F/U neurological evaluation May consider PNCV EMG studies and other studies  May consider radiofrequency rhizolysis or intraspinal procedures pending response to present treatment and F/U evaluation   Patient to call Pain Management Center should patient have concerns prior to scheduled return appointmentPain Management Discharge Instructions  General Discharge Instructions :  If you need to reach your doctor call: Monday-Friday 8:00 am - 4:00 pm at [phone] or toll free [phone].  After clinic hours [phone] to have operator reach doctor.  Bring all of your medication bottles to all your appointments in the pain clinic.  To cancel or reschedule your appointment with Pain Management please remember to call 24 hours in advance to avoid a fee.  Refer to the educational materials which you have been given on: General Risks, I had my Procedure. Discharge Instructions, Post Sedation.  Post Procedure Instructions:  The drugs you were given will stay in your system until tomorrow, so for the next 24 hours you should not drive, make any legal decisions or drink any alcoholic beverages.  You may eat anything you prefer, but it is better to start with liquids then soups and crackers, and gradually work up to solid foods.  Please notify your doctor immediately if you have any unusual bleeding, trouble breathing or pain that is not related to your normal pain.  Depending on the type of procedure  that was done, some parts of your body may feel week and/or numb.  This usually clears up by tonight or the next day.  Walk with the use of an assistive device or accompanied by an adult for the 24 hours.  You may use ice on the affected area for the first 24 hours.  Put ice in a Ziploc bag and cover with a towel and place against area 15 minutes on 15 minutes off.  You may switch to heat after 24 hours.GENERAL RISKS AND COMPLICATIONS  What are the risk, side effects and possible complications? Generally speaking, most procedures are safe.  However, with any procedure there are risks, side effects, and the possibility of complications.  The risks and complications are dependent upon the sites that are lesioned, or the type of nerve block to be performed.  The closer the procedure is to the spine, the more serious the risks are.  Great care is taken when placing the radio frequency needles, block needles or lesioning probes, but sometimes complications can occur. 1. Infection: Any time there is an injection through the skin, there is a risk of infection.  This is why sterile conditions are used for these blocks.  There are four possible types of infection. 1. Localized skin infection. 2. Central Nervous System Infection-This can be in the form of Meningitis, which can be deadly. 3. Epidural Infections-This can be in the form of an epidural abscess, which can cause pressure inside of the spine, causing compression of the spinal cord with subsequent paralysis. This would require an emergency surgery to decompress, and there are no guarantees that the patient would recover  from the paralysis. 4. Discitis-This is an infection of the intervertebral discs.  It occurs in about 1% of discography procedures.  It is difficult to treat and it may lead to surgery.        2. Pain: the needles have to go through skin and soft tissues, will cause soreness.       3. Damage to internal structures:  The nerves to be  lesioned may be near blood vessels or    other nerves which can be potentially damaged.       4. Bleeding: Bleeding is more common if the patient is taking blood thinners such as  aspirin, Coumadin, Ticiid, Plavix, etc., or if he/she have some genetic predisposition  such as hemophilia. Bleeding into the spinal canal can cause compression of the spinal  cord with subsequent paralysis.  This would require an emergency surgery to  decompress and there are no guarantees that the patient would recover from the  paralysis.       5. Pneumothorax:  Puncturing of a lung is a possibility, every time a needle is introduced in  the area of the chest or upper back.  Pneumothorax refers to free air around the  collapsed lung(s), inside of the thoracic cavity (chest cavity).  Another two possible  complications related to a similar event would include: Hemothorax and Chylothorax.   These are variations of the Pneumothorax, where instead of air around the collapsed  lung(s), you may have blood or chyle, respectively.       6. Spinal headaches: They may occur with any procedures in the area of the spine.       7. Persistent CSF (Cerebro-Spinal Fluid) leakage: This is a rare problem, but may occur  with prolonged intrathecal or epidural catheters either due to the formation of a fistulous  track or a dural tear.       8. Nerve damage: By working so close to the spinal cord, there is always a possibility of  nerve damage, which could be as serious as a permanent spinal cord injury with  paralysis.       9. Death:  Although rare, severe deadly allergic reactions known as "Anaphylactic  reaction" can occur to any of the medications used.      10. Worsening of the symptoms:  We can always make thing worse.  What are the chances of something like this happening? Chances of any of this occuring are extremely low.  By statistics, you have more of a chance of getting killed in a motor vehicle accident: while driving to the  hospital than any of the above occurring .  Nevertheless, you should be aware that they are possibilities.  In general, it is similar to taking a shower.  Everybody knows that you can slip, hit your head and get killed.  Does that mean that you should not shower again?  Nevertheless always keep in mind that statistics do not mean anything if you happen to be on the wrong side of them.  Even if a procedure has a 1 (one) in a 1,000,000 (million) chance of going wrong, it you happen to be that one..Also, keep in mind that by statistics, you have more of a chance of having something go wrong when taking medications.  Who should not have this procedure? If you are on a blood thinning medication (e.g. Coumadin, Plavix, see list of "Blood Thinners"), or if you have an active infection going on, you should not have the  procedure.  If you are taking any blood thinners, please inform your physician.  How should I prepare for this procedure?  Do not eat or drink anything at least six hours prior to the procedure.  Bring a driver with you .  It cannot be a taxi.  Come accompanied by an adult that can drive you back, and that is strong enough to help you if your legs get weak or numb from the local anesthetic.  Take all of your medicines the morning of the procedure with just enough water to swallow them.  If you have diabetes, make sure that you are scheduled to have your procedure done first thing in the morning, whenever possible.  If you have diabetes, take only half of your insulin dose and notify our nurse that you have done so as soon as you arrive at the clinic.  If you are diabetic, but only take blood sugar pills (oral hypoglycemic), then do not take them on the morning of your procedure.  You may take them after you have had the procedure.  Do not take aspirin or any aspirin-containing medications, at least eleven (11) days prior to the procedure.  They may prolong bleeding.  Wear loose fitting  clothing that may be easy to take off and that you would not mind if it got stained with Betadine or blood.  Do not wear any jewelry or perfume  Remove any nail coloring.  It will interfere with some of our monitoring equipment.  NOTE: Remember that this is not meant to be interpreted as a complete list of all possible complications.  Unforeseen problems may occur.  BLOOD THINNERS The following drugs contain aspirin or other products, which can cause increased bleeding during surgery and should not be taken for 2 weeks prior to and 1 week after surgery.  If you should need take something for relief of minor pain, you may take acetaminophen which is found in Tylenol,m Datril, Anacin-3 and Panadol. It is not blood thinner. The products listed below are.  Do not take any of the products listed below in addition to any listed on your instruction sheet.  A.P.C or A.P.C with Codeine Codeine Phosphate Capsules #3 Ibuprofen Ridaura  ABC compound Congesprin Imuran rimadil  Advil Cope Indocin Robaxisal  Alka-Seltzer Effervescent Pain Reliever and Antacid Coricidin or Coricidin-D  Indomethacin Rufen  Alka-Seltzer plus Cold Medicine Cosprin Ketoprofen S-A-C Tablets  Anacin Analgesic Tablets or Capsules Coumadin Korlgesic Salflex  Anacin Extra Strength Analgesic tablets or capsules CP-2 Tablets Lanoril Salicylate  Anaprox Cuprimine Capsules Levenox Salocol  Anexsia-D Dalteparin Magan Salsalate  Anodynos Darvon compound Magnesium Salicylate Sine-off  Ansaid Dasin Capsules Magsal Sodium Salicylate  Anturane Depen Capsules Marnal Soma  APF Arthritis pain formula Dewitt's Pills Measurin Stanback  Argesic Dia-Gesic Meclofenamic Sulfinpyrazone  Arthritis Bayer Timed Release Aspirin Diclofenac Meclomen Sulindac  Arthritis pain formula Anacin Dicumarol Medipren Supac  Analgesic (Safety coated) Arthralgen Diffunasal Mefanamic Suprofen  Arthritis Strength Bufferin Dihydrocodeine Mepro Compound Suprol   Arthropan liquid Dopirydamole Methcarbomol with Aspirin Synalgos  ASA tablets/Enseals Disalcid Micrainin Tagament  Ascriptin Doan's Midol Talwin  Ascriptin A/D Dolene Mobidin Tanderil  Ascriptin Extra Strength Dolobid Moblgesic Ticlid  Ascriptin with Codeine Doloprin or Doloprin with Codeine Momentum Tolectin  Asperbuf Duoprin Mono-gesic Trendar  Aspergum Duradyne Motrin or Motrin IB Triminicin  Aspirin plain, buffered or enteric coated Durasal Myochrisine Trigesic  Aspirin Suppositories Easprin Nalfon Trillsate  Aspirin with Codeine Ecotrin Regular or Extra Strength Naprosyn Uracel  Atromid-S Efficin Naproxen  Ursinus  Auranofin Capsules Elmiron Neocylate Vanquish  Axotal Emagrin Norgesic Verin  Azathioprine Empirin or Empirin with Codeine Normiflo Vitamin E  Azolid Emprazil Nuprin Voltaren  Bayer Aspirin plain, buffered or children's or timed BC Tablets or powders Encaprin Orgaran Warfarin Sodium  Buff-a-Comp Enoxaparin Orudis Zorpin  Buff-a-Comp with Codeine Equegesic Os-Cal-Gesic   Buffaprin Excedrin plain, buffered or Extra Strength Oxalid   Bufferin Arthritis Strength Feldene Oxphenbutazone   Bufferin plain or Extra Strength Feldene Capsules Oxycodone with Aspirin   Bufferin with Codeine Fenoprofen Fenoprofen Pabalate or Pabalate-SF   Buffets II Flogesic Panagesic   Buffinol plain or Extra Strength Florinal or Florinal with Codeine Panwarfarin   Buf-Tabs Flurbiprofen Penicillamine   Butalbital Compound Four-way cold tablets Penicillin   Butazolidin Fragmin Pepto-Bismol   Carbenicillin Geminisyn Percodan   Carna Arthritis Reliever Geopen Persantine   Carprofen Gold's salt Persistin   Chloramphenicol Goody's Phenylbutazone   Chloromycetin Haltrain Piroxlcam   Clmetidine heparin Plaquenil   Cllnoril Hyco-pap Ponstel   Clofibrate Hydroxy chloroquine Propoxyphen         Before stopping any of these medications, be sure to consult the physician who ordered them.  Some, such as  Coumadin (Warfarin) are ordered to prevent or treat serious conditions such as "deep thrombosis", "pumonary embolisms", and other heart problems.  The amount of time that you may need off of the medication may also vary with the medication and the reason for which you were taking it.  If you are taking any of these medications, please make sure you notify your pain physician before you undergo any procedures.

## 2016-05-17 ENCOUNTER — Telehealth: Payer: Self-pay | Admitting: *Deleted

## 2016-05-17 NOTE — Telephone Encounter (Signed)
Left voicemail to call our office if there are questions or concerns re; procedure on yesterday. 

## 2016-05-30 ENCOUNTER — Ambulatory Visit: Payer: Medicare Other | Attending: Pain Medicine | Admitting: Pain Medicine

## 2016-05-30 ENCOUNTER — Encounter: Payer: Self-pay | Admitting: Pain Medicine

## 2016-05-30 VITALS — BP 120/88 | HR 80 | Temp 97.7°F | Resp 18 | Ht 69.0 in | Wt 195.0 lb

## 2016-05-30 DIAGNOSIS — M5126 Other intervertebral disc displacement, lumbar region: Secondary | ICD-10-CM | POA: Insufficient documentation

## 2016-05-30 DIAGNOSIS — M48062 Spinal stenosis, lumbar region with neurogenic claudication: Secondary | ICD-10-CM

## 2016-05-30 DIAGNOSIS — M5030 Other cervical disc degeneration, unspecified cervical region: Secondary | ICD-10-CM

## 2016-05-30 DIAGNOSIS — M1990 Unspecified osteoarthritis, unspecified site: Secondary | ICD-10-CM | POA: Diagnosis not present

## 2016-05-30 DIAGNOSIS — M5136 Other intervertebral disc degeneration, lumbar region: Secondary | ICD-10-CM

## 2016-05-30 DIAGNOSIS — M48061 Spinal stenosis, lumbar region without neurogenic claudication: Secondary | ICD-10-CM

## 2016-05-30 DIAGNOSIS — M4806 Spinal stenosis, lumbar region: Secondary | ICD-10-CM | POA: Insufficient documentation

## 2016-05-30 DIAGNOSIS — M533 Sacrococcygeal disorders, not elsewhere classified: Secondary | ICD-10-CM | POA: Diagnosis not present

## 2016-05-30 DIAGNOSIS — M545 Low back pain: Secondary | ICD-10-CM | POA: Diagnosis present

## 2016-05-30 DIAGNOSIS — M47817 Spondylosis without myelopathy or radiculopathy, lumbosacral region: Secondary | ICD-10-CM

## 2016-05-30 DIAGNOSIS — M47812 Spondylosis without myelopathy or radiculopathy, cervical region: Secondary | ICD-10-CM

## 2016-05-30 DIAGNOSIS — M5116 Intervertebral disc disorders with radiculopathy, lumbar region: Secondary | ICD-10-CM | POA: Diagnosis not present

## 2016-05-30 DIAGNOSIS — M79606 Pain in leg, unspecified: Secondary | ICD-10-CM | POA: Diagnosis present

## 2016-05-30 MED ORDER — CYCLOBENZAPRINE HCL 10 MG PO TABS: ORAL_TABLET | ORAL | Status: DC

## 2016-05-30 MED ORDER — OXYCODONE HCL 10 MG PO TABS: ORAL_TABLET | ORAL | Status: DC

## 2016-05-30 NOTE — Patient Instructions (Signed)
PLAN   Continue present medications oxycodone and  Flexeril  F/U PCP Dr. Hawkins for evaliation of  BP and general medical  condition  F/U surgical evaluation. Patient will follow-up with Dr. David [name] as discussed. Patient recently was evaluated by Dr. [name] who decided no surgery at this time and patient is to continue treatment in pain management  F/U neurological evaluation May consider PNCV EMG studies and other studies  May consider radiofrequency rhizolysis or intraspinal procedures pending response to present treatment and F/U evaluation   Patient to call Pain Management Center should patient have concerns prior to scheduled return appointment 

## 2016-05-30 NOTE — Progress Notes (Signed)
Subjective:    Patient ID: Mark Snow, male    DOB: March 22, 1971, 45 y.o.   MRN: [id number]  HPI The patient is a 45 year old gentleman who returns to pain management for further evaluation and treatment of pain involving the lower back lower extremity region with pain of the neck and upper extremity region of lesser degree. The patient is status post surgical intervention of both the cervical and lumbar regions. The patient is undergone reevaluation with his neurosurgeon Dr. Marikay Alar without plans for additional surgical intervention. The patient has been recommended to continue treatment in pain management Center at this time. The patient states that he had significant relief of his pain involving the right lower extremity when he underwent movement block of the articular branches of the femoral nerve and the obturator nerve for right hip and lower extremity pain. At the present time we will continue medications consisting of Flexeril and oxycodone. The patient will call pain management should there be concerns regarding condition prior to scheduled return appointment. All agreed to suggested treatment plan    Review of Systems     Objective:   Physical Exam  There was well-healed surgical scar of the cervical region without increased warmth and erythema in the region of the scar. There was limited range of motion of motion of the cervical spine with tenderness to palpation of mild to moderate degree of the splenius capitis and occipitalis regions. There was tenderness of the acromioclavicular and glenohumeral joint regions of mild to moderate degree and patient appeared to be with unremarkable Spurling's maneuver. The patient appeared to be with bilaterally equal grip strength and Tinel and Phalen's maneuver were without increased pain of significant degree. Palpation of the thoracic region was with no crepitus of the thoracic region noted there was moderate muscle spasms noted in the mid  and lower thoracic paraspinal musculature regions. Palpation over the lumbar paraspinal musculature region lumbar facet region was attends to palpation of moderate degree with lateral bending rotation extension and palpation of the lumbar facets reproducing moderate discomfort. Palpation over the lumbar paraspinal musculature region lumbar facet region was with moderate tenderness to palpation with lateral bending rotation extension and palpation of the lumbar facets reproducing moderate discomfort. There was mild to moderate tenderness of the PSIS and PII S region. Patrick's maneuver associated with moderate discomfort. There was mild tenderness of the greater trochanteric region iliotibial band region. Straight leg raising was tolerates approximately 20 without a definite increased pain with dorsiflexion noted. EHL strength appeared to be decreased there was question decreased sensation of the L5 dermatomal distribution. There was negative clonus negative Homans. Abdomen was nontender with no costovertebral tenderness noted      Assessment & Plan:      Degenerative joint disease of hip  Sacroiliac joint dysfunction  Degenerative disc disease lumbar spine Laminectomy L2 and L3 with decompression of the central canal. 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 Moderate right and mild left foraminal stenosis at L3-4 secondary to lateral disc protrusions and facet hypertrophy. Mild subarticular and mild to moderate foraminal stenosis at L4-5 secondary to acquired and congenital factors worse on the left. Mild disc bulging at L5-S1 without significant stenosis.  Lumbar radiculopathy  Lumbar facet syndrome  Sacroiliac joint dysfunction      PLAN   Continue present medications oxycodone and  Flexeril  F/U PCP Dr. Juanetta Gosling for evaliation of  BP and general medical  condition  F/U surgical evaluation. Patient will follow-up with Dr.  Marikay Alaravid [name] as discussed. Patient  recently was evaluated by Dr. Yetta BarreJones who decided no surgery at this time and patient is to continue treatment in pain management  F/U neurological evaluation May consider PN[facility] EMG studies and other studies  May consider radiofrequency rhizolysis or intraspinal procedures pending response to present treatment and F/U evaluation   Patient to call Pain Management Center should patient have concerns prior to scheduled return appointment

## 2016-06-28 ENCOUNTER — Ambulatory Visit: Payer: Medicare Other | Admitting: Pain Medicine

## 2016-06-29 ENCOUNTER — Ambulatory Visit: Payer: Medicare Other | Attending: Pain Medicine | Admitting: Pain Medicine

## 2016-06-29 ENCOUNTER — Encounter: Payer: Self-pay | Admitting: Pain Medicine

## 2016-06-29 VITALS — BP 148/78 | HR 85 | Temp 97.8°F | Resp 14 | Ht 69.0 in | Wt 190.0 lb

## 2016-06-29 DIAGNOSIS — M48062 Spinal stenosis, lumbar region with neurogenic claudication: Secondary | ICD-10-CM

## 2016-06-29 DIAGNOSIS — M47817 Spondylosis without myelopathy or radiculopathy, lumbosacral region: Secondary | ICD-10-CM

## 2016-06-29 DIAGNOSIS — M5136 Other intervertebral disc degeneration, lumbar region: Secondary | ICD-10-CM

## 2016-06-29 DIAGNOSIS — M545 Low back pain: Secondary | ICD-10-CM | POA: Diagnosis present

## 2016-06-29 DIAGNOSIS — M1610 Unilateral primary osteoarthritis, unspecified hip: Secondary | ICD-10-CM | POA: Insufficient documentation

## 2016-06-29 DIAGNOSIS — M5116 Intervertebral disc disorders with radiculopathy, lumbar region: Secondary | ICD-10-CM | POA: Diagnosis not present

## 2016-06-29 DIAGNOSIS — M5030 Other cervical disc degeneration, unspecified cervical region: Secondary | ICD-10-CM

## 2016-06-29 DIAGNOSIS — M47812 Spondylosis without myelopathy or radiculopathy, cervical region: Secondary | ICD-10-CM

## 2016-06-29 DIAGNOSIS — M5126 Other intervertebral disc displacement, lumbar region: Secondary | ICD-10-CM | POA: Insufficient documentation

## 2016-06-29 DIAGNOSIS — M4806 Spinal stenosis, lumbar region: Secondary | ICD-10-CM | POA: Diagnosis not present

## 2016-06-29 DIAGNOSIS — M48061 Spinal stenosis, lumbar region without neurogenic claudication: Secondary | ICD-10-CM

## 2016-06-29 DIAGNOSIS — M542 Cervicalgia: Secondary | ICD-10-CM | POA: Diagnosis present

## 2016-06-29 DIAGNOSIS — M533 Sacrococcygeal disorders, not elsewhere classified: Secondary | ICD-10-CM | POA: Insufficient documentation

## 2016-06-29 MED ORDER — OXYCODONE HCL 10 MG PO TABS: ORAL_TABLET | ORAL | Status: DC

## 2016-06-29 MED ORDER — CYCLOBENZAPRINE HCL 10 MG PO TABS: ORAL_TABLET | ORAL | Status: DC

## 2016-06-29 NOTE — Progress Notes (Signed)
Safety precautions to be maintained throughout the outpatient stay will include: orient to surroundings, keep bed in low position, maintain call bell within reach at all times, provide assistance with transfer out of bed and ambulation.  

## 2016-06-29 NOTE — Progress Notes (Signed)
Subjective:    Patient ID: Mark Snow, male    DOB: [birth year], 45 y.o.   MRN: [id number]  HPI  The patient is a 45 year old gentleman who returns to pain management for further evaluation and treatment of pain involving the region of the lower back lower extremity region and neck and upper extremity regions. The patient is status post surgical intervention of the cervical and lumbar regions. The patient has had significant pain of the lumbar lower extremity region especially involving the region of the hip on the right with lower extremity pain as well continuing more distally down the right lower extremity. The patient continues medications oxycodone and Flexeril and is undergone reevaluation with Dr. Marikay Alaravid [name] to has recommended patient continue treatment in pain management and is without recommendations for additional surgical intervention at this time. We discussed patient's condition and we will proceed with block of the articular branches of the obturator and femoral nerves at time of return appointment. All agreed to suggested treatment plan.  Review of Systems     Objective:   Physical Exam  There was tenderness to palpation of the paraspinal musculature region of the cervical region cervical facet region a mild to moderate degree with well-healed surgical scar of the cervical region without increased warmth and erythema in the region of the scar. Palpation over the acromioclavicular and glenohumeral joint regions reproduces minimal discomfort. Palpation of the thoracic region was a tennis to palpation with significant muscle spasm of the mid and lower thoracic regions. Palpation over the thoracic region was without crepitus of the thoracic region. There was tenderness over the lumbar paraspinal musculature region lumbar facet region with lateral bending rotation extension and palpation over the lumbar facets reproducing moderate to moderately severe discomfort with severe  tenderness over the PSIS and PII S region. The patient was with positive Patrick's maneuver as well. There was tenderness over the greater trochanteric region iliotibial band region of moderate degree. Straight leg raising was tolerates approximately 20 without definite increased pain with dorsiflexion noted. There was negative clonus negative Homans. There was questionably decreased sensation L5 dermatomal distribution. Abdomen was nontender with no costovertebral angle tenderness noted      Assessment & Plan:      Degenerative joint disease of hip  Sacroiliac joint dysfunction  Degenerative disc disease lumbar spine Laminectomy L2 and L3 with decompression of the central canal. 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 Moderate right and mild left foraminal stenosis at L3-4 secondary to lateral disc protrusions and facet hypertrophy. Mild subarticular and mild to moderate foraminal stenosis at L4-5 secondary to acquired and congenital factors worse on the left. Mild disc bulging at L5-S1 without significant stenosis.  Lumbar radiculopathy  Lumbar facet syndrome  Sacroiliac joint dysfunction     PLAN   Continue present medications oxycodone and  Flexeril  Femoral nerve block and obturator nerve block (articular branches) to be performed at time return appointment  F/U PCP Dr. Juanetta [name] for evaliation of  BP and general medical  condition  F/U surgical evaluation. Patient will follow-up with Dr. Marikay Alaravid [name] as discussed. Patient recently was evaluated by Dr. Yetta BarreJones who decided no surgery at this time and patient is to continue treatment in pain management  F/U neurological evaluation May consider PNCV EMG studies and other studies  May consider radiofrequency rhizolysis or intraspinal procedures pending response to present treatment and F/U evaluation   Patient to call Pain Management Center should patient have concerns prior to scheduled return  appointment

## 2016-06-29 NOTE — Patient Instructions (Addendum)
PLAN   Continue present medications oxycodone and  Flexeril  Femoral nerve block and obturator nerve block (articular branches) to be performed at time return appointment  F/U PCP Dr. Juanetta [name] for evaliation of  BP and general medical  condition  F/U surgical evaluation. Patient will follow-up with Dr. Marikay Alaravid [name] as discussed. Patient recently was evaluated by Dr. Yetta BarreJones who decided no surgery at this time and patient is to continue treatment in pain management  F/U neurological evaluation May consider PNCV EMG studies and other studies  May consider radiofrequency rhizolysis or intraspinal procedures pending response to present treatment and F/U evaluation   Patient to call Pain Management Center should patient have concerns prior to scheduled return appointmentGENERAL RISKS AND COMPLICATIONS  What are the risk, side effects and possible complications? Generally speaking, most procedures are safe.  However, with any procedure there are risks, side effects, and the possibility of complications.  The risks and complications are dependent upon the sites that are lesioned, or the type of nerve block to be performed.  The closer the procedure is to the spine, the more serious the risks are.  Great care is taken when placing the radio frequency needles, block needles or lesioning probes, but sometimes complications can occur. 1. Infection: Any time there is an injection through the skin, there is a risk of infection.  This is why sterile conditions are used for these blocks.  There are four possible types of infection. 1. Localized skin infection. 2. Central Nervous System Infection-This can be in the form of Meningitis, which can be deadly. 3. Epidural Infections-This can be in the form of an epidural abscess, which can cause pressure inside of the spine, causing compression of the spinal cord with subsequent paralysis. This would require an emergency surgery to decompress, and there are no  guarantees that the patient would recover from the paralysis. 4. Discitis-This is an infection of the intervertebral discs.  It occurs in about 1% of discography procedures.  It is difficult to treat and it may lead to surgery.        2. Pain: the needles have to go through skin and soft tissues, will cause soreness.       3. Damage to internal structures:  The nerves to be lesioned may be near blood vessels or    other nerves which can be potentially damaged.       4. Bleeding: Bleeding is more common if the patient is taking blood thinners such as  aspirin, Coumadin, Ticiid, Plavix, etc., or if he/she have some genetic predisposition  such as hemophilia. Bleeding into the spinal canal can cause compression of the spinal  cord with subsequent paralysis.  This would require an emergency surgery to  decompress and there are no guarantees that the patient would recover from the  paralysis.       5. Pneumothorax:  Puncturing of a lung is a possibility, every time a needle is introduced in  the area of the chest or upper back.  Pneumothorax refers to free air around the  collapsed lung(s), inside of the thoracic cavity (chest cavity).  Another two possible  complications related to a similar event would include: Hemothorax and Chylothorax.   These are variations of the Pneumothorax, where instead of air around the collapsed  lung(s), you may have blood or chyle, respectively.       6. Spinal headaches: They may occur with any procedures in the area of the spine.  7. Persistent CSF (Cerebro-Spinal Fluid) leakage: This is a rare problem, but may occur  with prolonged intrathecal or epidural catheters either due to the formation of a fistulous  track or a dural tear.       8. Nerve damage: By working so close to the spinal cord, there is always a possibility of  nerve damage, which could be as serious as a permanent spinal cord injury with  paralysis.       9. Death:  Although rare, severe deadly allergic  reactions known as "Anaphylactic  reaction" can occur to any of the medications used.      10. Worsening of the symptoms:  We can always make thing worse.  What are the chances of something like this happening? Chances of any of this occuring are extremely low.  By statistics, you have more of a chance of getting killed in a motor vehicle accident: while driving to the hospital than any of the above occurring .  Nevertheless, you should be aware that they are possibilities.  In general, it is similar to taking a shower.  Everybody knows that you can slip, hit your head and get killed.  Does that mean that you should not shower again?  Nevertheless always keep in mind that statistics do not mean anything if you happen to be on the wrong side of them.  Even if a procedure has a 1 (one) in a 1,000,000 (million) chance of going wrong, it you happen to be that one..Also, keep in mind that by statistics, you have more of a chance of having something go wrong when taking medications.  Who should not have this procedure? If you are on a blood thinning medication (e.g. Coumadin, Plavix, see list of "Blood Thinners"), or if you have an active infection going on, you should not have the procedure.  If you are taking any blood thinners, please inform your physician.  How should I prepare for this procedure?  Do not eat or drink anything at least six hours prior to the procedure.  Bring a driver with you .  It cannot be a taxi.  Come accompanied by an adult that can drive you back, and that is strong enough to help you if your legs get weak or numb from the local anesthetic.  Take all of your medicines the morning of the procedure with just enough water to swallow them.  If you have diabetes, make sure that you are scheduled to have your procedure done first thing in the morning, whenever possible.  If you have diabetes, take only half of your insulin dose and notify our nurse that you have done so as soon  as you arrive at the clinic.  If you are diabetic, but only take blood sugar pills (oral hypoglycemic), then do not take them on the morning of your procedure.  You may take them after you have had the procedure.  Do not take aspirin or any aspirin-containing medications, at least eleven (11) days prior to the procedure.  They may prolong bleeding.  Wear loose fitting clothing that may be easy to take off and that you would not mind if it got stained with Betadine or blood.  Do not wear any jewelry or perfume  Remove any nail coloring.  It will interfere with some of our monitoring equipment.  NOTE: Remember that this is not meant to be interpreted as a complete list of all possible complications.  Unforeseen problems may occur.  BLOOD THINNERS  The following drugs contain aspirin or other products, which can cause increased bleeding during surgery and should not be taken for 2 weeks prior to and 1 week after surgery.  If you should need take something for relief of minor pain, you may take acetaminophen which is found in Tylenol,m Datril, Anacin-3 and Panadol. It is not blood thinner. The products listed below are.  Do not take any of the products listed below in addition to any listed on your instruction sheet.  A.P.C or A.P.C with Codeine Codeine Phosphate Capsules #3 Ibuprofen Ridaura  ABC compound Congesprin Imuran rimadil  Advil Cope Indocin Robaxisal  Alka-Seltzer Effervescent Pain Reliever and Antacid Coricidin or Coricidin-D  Indomethacin Rufen  Alka-Seltzer plus Cold Medicine Cosprin Ketoprofen S-A-C Tablets  Anacin Analgesic Tablets or Capsules Coumadin Korlgesic Salflex  Anacin Extra Strength Analgesic tablets or capsules CP-2 Tablets Lanoril Salicylate  Anaprox Cuprimine Capsules Levenox Salocol  Anexsia-D Dalteparin Magan Salsalate  Anodynos Darvon compound Magnesium Salicylate Sine-off  Ansaid Dasin Capsules Magsal Sodium Salicylate  Anturane Depen Capsules Marnal Soma   APF Arthritis pain formula Dewitt's Pills Measurin Stanback  Argesic Dia-Gesic Meclofenamic Sulfinpyrazone  Arthritis Bayer Timed Release Aspirin Diclofenac Meclomen Sulindac  Arthritis pain formula Anacin Dicumarol Medipren Supac  Analgesic (Safety coated) Arthralgen Diffunasal Mefanamic Suprofen  Arthritis Strength Bufferin Dihydrocodeine Mepro Compound Suprol  Arthropan liquid Dopirydamole Methcarbomol with Aspirin Synalgos  ASA tablets/Enseals Disalcid Micrainin Tagament  Ascriptin Doan's Midol Talwin  Ascriptin A/D Dolene Mobidin Tanderil  Ascriptin Extra Strength Dolobid Moblgesic Ticlid  Ascriptin with Codeine Doloprin or Doloprin with Codeine Momentum Tolectin  Asperbuf Duoprin Mono-gesic Trendar  Aspergum Duradyne Motrin or Motrin IB Triminicin  Aspirin plain, buffered or enteric coated Durasal Myochrisine Trigesic  Aspirin Suppositories Easprin Nalfon Trillsate  Aspirin with Codeine Ecotrin Regular or Extra Strength Naprosyn Uracel  Atromid-S Efficin Naproxen Ursinus  Auranofin Capsules Elmiron Neocylate Vanquish  Axotal Emagrin Norgesic Verin  Azathioprine Empirin or Empirin with Codeine Normiflo Vitamin E  Azolid Emprazil Nuprin Voltaren  Bayer Aspirin plain, buffered or children's or timed BC Tablets or powders Encaprin Orgaran Warfarin Sodium  Buff-a-Comp Enoxaparin Orudis Zorpin  Buff-a-Comp with Codeine Equegesic Os-Cal-Gesic   Buffaprin Excedrin plain, buffered or Extra Strength Oxalid   Bufferin Arthritis Strength Feldene Oxphenbutazone   Bufferin plain or Extra Strength Feldene Capsules Oxycodone with Aspirin   Bufferin with Codeine Fenoprofen Fenoprofen Pabalate or Pabalate-SF   Buffets II Flogesic Panagesic   Buffinol plain or Extra Strength Florinal or Florinal with Codeine Panwarfarin   Buf-Tabs Flurbiprofen Penicillamine   Butalbital Compound Four-way cold tablets Penicillin   Butazolidin Fragmin Pepto-Bismol   Carbenicillin Geminisyn Percodan   Carna  Arthritis Reliever Geopen Persantine   Carprofen Gold's salt Persistin   Chloramphenicol Goody's Phenylbutazone   Chloromycetin Haltrain Piroxlcam   Clmetidine heparin Plaquenil   Cllnoril Hyco-pap Ponstel   Clofibrate Hydroxy chloroquine Propoxyphen         Before stopping any of these medications, be sure to consult the physician who ordered them.  Some, such as Coumadin (Warfarin) are ordered to prevent or treat serious conditions such as "deep thrombosis", "pumonary embolisms", and other heart problems.  The amount of time that you may need off of the medication may also vary with the medication and the reason for which you were taking it.  If you are taking any of these medications, please make sure you notify your pain physician before you undergo any procedures.

## 2016-07-09 ENCOUNTER — Ambulatory Visit: Payer: Medicare Other | Attending: Pain Medicine | Admitting: Pain Medicine

## 2016-07-09 ENCOUNTER — Encounter: Payer: Self-pay | Admitting: Pain Medicine

## 2016-07-09 DIAGNOSIS — M25551 Pain in right hip: Secondary | ICD-10-CM | POA: Diagnosis present

## 2016-07-09 DIAGNOSIS — M533 Sacrococcygeal disorders, not elsewhere classified: Secondary | ICD-10-CM

## 2016-07-09 DIAGNOSIS — M5030 Other cervical disc degeneration, unspecified cervical region: Secondary | ICD-10-CM

## 2016-07-09 DIAGNOSIS — M47817 Spondylosis without myelopathy or radiculopathy, lumbosacral region: Secondary | ICD-10-CM

## 2016-07-09 DIAGNOSIS — M1611 Unilateral primary osteoarthritis, right hip: Secondary | ICD-10-CM | POA: Insufficient documentation

## 2016-07-09 DIAGNOSIS — M51369 Other intervertebral disc degeneration, lumbar region without mention of lumbar back pain or lower extremity pain: Secondary | ICD-10-CM

## 2016-07-09 DIAGNOSIS — M5136 Other intervertebral disc degeneration, lumbar region: Secondary | ICD-10-CM

## 2016-07-09 DIAGNOSIS — M48062 Spinal stenosis, lumbar region with neurogenic claudication: Secondary | ICD-10-CM

## 2016-07-09 DIAGNOSIS — M47812 Spondylosis without myelopathy or radiculopathy, cervical region: Secondary | ICD-10-CM

## 2016-07-09 DIAGNOSIS — M48061 Spinal stenosis, lumbar region without neurogenic claudication: Secondary | ICD-10-CM

## 2016-07-09 MED ORDER — FENTANYL CITRATE (PF) 100 MCG/2ML IJ SOLN
100.0000 ug | Freq: Once | INTRAMUSCULAR | Status: AC
  Administered 2016-07-09: 100 ug via INTRAVENOUS
  Filled 2016-07-09: qty 2

## 2016-07-09 MED ORDER — LIDOCAINE HCL (PF) 1 % IJ SOLN
10.0000 mL | Freq: Once | INTRAMUSCULAR | Status: AC
  Administered 2016-07-09: 10 mL via SUBCUTANEOUS
  Filled 2016-07-09: qty 10

## 2016-07-09 MED ORDER — BUPIVACAINE HCL (PF) 0.25 % IJ SOLN
30.0000 mL | Freq: Once | INTRAMUSCULAR | Status: AC
  Administered 2016-07-09: 30 mL
  Filled 2016-07-09: qty 30

## 2016-07-09 MED ORDER — TRIAMCINOLONE ACETONIDE 40 MG/ML IJ SUSP
40.0000 mg | Freq: Once | INTRAMUSCULAR | Status: AC
  Administered 2016-07-09: 40 mg
  Filled 2016-07-09: qty 1

## 2016-07-09 MED ORDER — CIPROFLOXACIN IN D5W 400 MG/200ML IV SOLN
400.0000 mg | Freq: Once | INTRAVENOUS | Status: AC
  Administered 2016-07-09: 400 mg via INTRAVENOUS
  Filled 2016-07-09: qty 200

## 2016-07-09 MED ORDER — IOPAMIDOL (ISOVUE-M 200) INJECTION 41%
INTRAMUSCULAR | Status: AC
  Filled 2016-07-09: qty 10

## 2016-07-09 MED ORDER — CIPROFLOXACIN HCL 250 MG PO TABS: 250.0000 mg | ORAL_TABLET | Freq: Two times a day (BID) | ORAL | Status: DC

## 2016-07-09 MED ORDER — MIDAZOLAM HCL 5 MG/5ML IJ SOLN
5.0000 mg | Freq: Once | INTRAMUSCULAR | Status: AC
  Administered 2016-07-09: 5 mg via INTRAVENOUS
  Filled 2016-07-09: qty 5

## 2016-07-09 MED ORDER — ORPHENADRINE CITRATE 30 MG/ML IJ SOLN
60.0000 mg | Freq: Once | INTRAMUSCULAR | Status: DC
  Filled 2016-07-09: qty 2

## 2016-07-09 MED ORDER — LACTATED RINGERS IV SOLN
1000.0000 mL | INTRAVENOUS | Status: DC
Start: 2016-07-09 — End: 2016-11-27
  Administered 2016-07-09: 1000 mL via INTRAVENOUS

## 2016-07-09 NOTE — Patient Instructions (Addendum)
PLAN   Continue present medications oxycodone and  Flexeril  Please obtain Cipro antibiotic and begin taking Cipro antibiotic today as prescribed  F/U PCP Dr. Juanetta [name] for evaliation of  BP and general medical  condition  F/U surgical evaluation. Patient will follow-up with Dr. Marikay Alaravid [name] as discussed. Patient was previously evaluated by Dr. Yetta BarreJones who decided no surgery at this time and patient is to continue treatment in pain management Orthopedic evaluation to be considered as discussed  F/U neurological evaluation May consider [facility] EMG studies and other studies  May consider radiofrequency rhizolysis or intraspinal procedures pending response to present treatment and F/U evaluation   Patient to call Pain Management Center should patient have concerns prior to scheduled return appointmentPain Management Discharge Instructions  General Discharge Instructions :  If you need to reach your doctor call: Monday-Friday 8:00 am - 4:00 pm at [phone] or toll free [phone].  After clinic hours [phone] to have operator reach doctor.  Bring all of your medication bottles to all your appointments in the pain clinic.  To cancel or reschedule your appointment with Pain Management please remember to call 24 hours in advance to avoid a fee.  Refer to the educational materials which you have been given on: General Risks, I had my Procedure. Discharge Instructions, Post Sedation.  Post Procedure Instructions:  The drugs you were given will stay in your system until tomorrow, so for the next 24 hours you should not drive, make any legal decisions or drink any alcoholic beverages.  You may eat anything you prefer, but it is better to start with liquids then soups and crackers, and gradually work up to solid foods.  Please notify your doctor immediately if you have any unusual bleeding, trouble breathing or pain that is not related to your normal pain.  Depending on the type of procedure  that was done, some parts of your body may feel week and/or numb.  This usually clears up by tonight or the next day.  Walk with the use of an assistive device or accompanied by an adult for the 24 hours.  You may use ice on the affected area for the first 24 hours.  Put ice in a Ziploc bag and cover with a towel and place against area 15 minutes on 15 minutes off.  You may switch to heat after 24 hours.

## 2016-07-09 NOTE — Progress Notes (Signed)
Subjective:    Patient ID: Mark Snow, male    DOB: Mar 23, 1971, 45 y.o.   MRN: [id number]  HPI                               FEMORAL AND OBTURATOR NERVE BLOCKS FOR RIGHT HIP PAIN    The patient is a 45 -year-old gentleman who returns to pain management for further evaluation and treatment of severely debilitating right hip pain.  Prior studies have revealed the patient to be with severe degenerative joint disease of the right hip.. Decision has been made to proceed with block of the articular branches of the femoral and obturator nerves to decrease the severity of patient's symptoms, minimize progression of patient's symptoms, and avoid the need for more involved treatment. The risks, benefits, and expectations of the procedure have been discussed with and explained to the patient who is with understanding and is in agreement to proceed with block of the articular branches of the femoral and obturator nerves to treat pain of the hip. All are in agreement with suggested treatment plan.      DESCRIPTION OF PROCEDURE: Femoral And Obturator Articular Branch Nerve Blocks    The patient was taken to fluoroscopy suite and assumed the supine position. EKG, blood pressure, pulse, pulse oximetry, and capnograph monitors were all in place. IV Versed and IV fentanyl conscious sedation was obtained. Betadine prep of proposed needle entry sites was accomplished.  Block Of The Articular Branch Of Femoral Nerve   Under fluoroscopic guidance a local anesthetic skin wheal of 1.5% preservative-free lidocaine was accomplished at the proposed needle entry site for needle entry for block of the articular branch of the femoral nerve. Under fluoroscopic guidance a 22-gauge needle was inserted at the 12:00 position of the acetabulum and slightly superior to the acetabular rim.. One ml of Isovue contrast was injected via  the 22-gauge needle to confirm accurate placement of the needle for block of the  articular branch of the femoral nerve. Following documentation of accurate needle placement, 1 ml of 0.25% bupivacaine was injected for block of the articular branch of the femoral nerve.  Block Of The Articular Branch Of Obturator Nerve  Under fluoroscopic guidance a local anesthetic skin wheal of 1.5% preservative-free lidocaine was accomplished at the proposed needle entry site for needle entry for block of the articular branch of the obturator nerve. Under fluoroscopic guidance a 22-gauge needle was inserted at the incisura of the acetabulum until contact of bone was accomplished.(The needle was placed in the region known as the teardrop formation).  1 ml of Isovue was injected via the 22-gauge needle under fluoroscopic guidance for confirmation of accurate needle placement for block of the articular branch of the obturator nerve. Following documentation of accurate needle placement, 1 ml of 0.25% bupivacaine was injected. the 22-gauge needle.    The patient tolerated the procedure well    PLAN  Continue present medications Flexeril and oxycodone  F/U PCP Dr. Juanetta [name] for evaliation of  BP and general medical  condition  F/U surgical evaluation. Patient will follow-up with Dr. Marikay Alaravid [name] as recommended patient continue treatment in pain management  F/U neurological evaluation. May consider PNCV EMG studies and other studies  May consider radiofrequency rhizolysis or intraspinal procedures pending response to present treatment and F/U evaluation   Patient to call Pain Management Center should patient have concerns prior to scheduled return appointment.   Review  of Systems     Objective:   Physical Exam        Assessment & Plan:

## 2016-07-09 NOTE — Progress Notes (Signed)
Patient here for procedure d/t lower back pain. Safety precautions to be maintained throughout the outpatient stay will include: orient to surroundings, keep bed in low position, maintain call bell within reach at all times, provide assistance with transfer out of bed and ambulation.  

## 2016-07-10 ENCOUNTER — Telehealth: Payer: Self-pay | Admitting: *Deleted

## 2016-07-10 NOTE — Telephone Encounter (Signed)
Left voicemail with patient to call our office if there are questions or concerns re; procedure on yesterday. 

## 2016-07-31 ENCOUNTER — Ambulatory Visit: Payer: Medicare Other | Attending: Pain Medicine | Admitting: Pain Medicine

## 2016-07-31 ENCOUNTER — Encounter: Payer: Self-pay | Admitting: Pain Medicine

## 2016-07-31 VITALS — BP 133/80 | HR 70 | Resp 18 | Ht 69.0 in | Wt 190.0 lb

## 2016-07-31 DIAGNOSIS — Z9889 Other specified postprocedural states: Secondary | ICD-10-CM | POA: Insufficient documentation

## 2016-07-31 DIAGNOSIS — M47817 Spondylosis without myelopathy or radiculopathy, lumbosacral region: Secondary | ICD-10-CM

## 2016-07-31 DIAGNOSIS — M5126 Other intervertebral disc displacement, lumbar region: Secondary | ICD-10-CM | POA: Insufficient documentation

## 2016-07-31 DIAGNOSIS — M545 Low back pain: Secondary | ICD-10-CM | POA: Diagnosis present

## 2016-07-31 DIAGNOSIS — M48062 Spinal stenosis, lumbar region with neurogenic claudication: Secondary | ICD-10-CM

## 2016-07-31 DIAGNOSIS — M1610 Unilateral primary osteoarthritis, unspecified hip: Secondary | ICD-10-CM | POA: Diagnosis not present

## 2016-07-31 DIAGNOSIS — M5116 Intervertebral disc disorders with radiculopathy, lumbar region: Secondary | ICD-10-CM | POA: Insufficient documentation

## 2016-07-31 DIAGNOSIS — M4806 Spinal stenosis, lumbar region: Secondary | ICD-10-CM | POA: Insufficient documentation

## 2016-07-31 DIAGNOSIS — M5136 Other intervertebral disc degeneration, lumbar region: Secondary | ICD-10-CM

## 2016-07-31 DIAGNOSIS — M5030 Other cervical disc degeneration, unspecified cervical region: Secondary | ICD-10-CM

## 2016-07-31 DIAGNOSIS — M533 Sacrococcygeal disorders, not elsewhere classified: Secondary | ICD-10-CM | POA: Insufficient documentation

## 2016-07-31 DIAGNOSIS — M47812 Spondylosis without myelopathy or radiculopathy, cervical region: Secondary | ICD-10-CM

## 2016-07-31 MED ORDER — CYCLOBENZAPRINE HCL 10 MG PO TABS: ORAL_TABLET | ORAL | 0 refills | Status: DC

## 2016-07-31 MED ORDER — OXYCODONE HCL 10 MG PO TABS: ORAL_TABLET | ORAL | 0 refills | Status: DC

## 2016-07-31 NOTE — Progress Notes (Signed)
   The patient is a 45 year old gentleman who returns to pain management for further evaluation and treatment of pain involving the lumbar lower extremity region and cervical and upper extremity regions. The patient is status post surgical intervention of the cervical and lumbar regions. The patient is status post LOC of the articular branches of the femoral and obturator nerves and had some improvement of pain involving the region of the right hip and lower extremity region. We discussed patient's condition on today's visit and patient is without plans for additional surgery. The patient will follow-up with Dr. Marikay Alaravid [name] as discussed. Dr. Yetta BarreJones has recommended patient to continue treatment in pain management at this time without plans for additional surgical intervention. The patient stated that he was tolerating oxycodone and Flexeril well and that he has had some decrease in his pain since undergoing block of the articular branches of the femoral and obturator nerves. We will continue presently prescribed medications and will consider modifications of treatment regimen pending follow-up evaluation. All agreed to suggested treatment plan      Physical examination  There was tenderness of the cervical facet cervical paraspinal musculature region with tenderness of the splenius capitis and occipitalis region a moderate degree. Patient was with tenderness of the acromioclavicular and glenohumeral joint region and patient appeared to be unremarkable Spurling's maneuver. The patient was at unremarkable drop test as well. Tinel and Phalen's maneuver were without increase of pain of significant degree. Patient appeared to be with bilaterally equal grip strength. Palpation over the region of the thoracic region was with no crepitus of the thoracic region noted. Palpation over the lumbar paraspinal musculatures and lumbar facet region was shaved with moderate discomfort with lateral bending rotation extension  and palpation of the lumbar facets reproducing moderate discomfort. There was moderate tenderness of the PSIS and PII S region as well as the greater trochanteric region iliotibial band region. Straight leg raise was tolerates approximately 20 without increased pain with dorsiflexion noted. DTRs appeared to be trace at the knees and there was negative clonus negative Homans.. There was questionably decreased sensation of the L5 dermatomal distribution. There was negative clonus negative Homans. Abdomen nontender with no costovertebral tenderness noted     Assessment   Degenerative joint disease of hip  Sacroiliac joint dysfunction  Degenerative disc disease lumbar spine Laminectomy L2 and L3 with decompression of the central canal. 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 Moderate right and mild left foraminal stenosis at L3-4 secondary to lateral disc protrusions and facet hypertrophy. Mild subarticular and mild to moderate foraminal stenosis at L4-5 secondary to acquired and congenital factors worse on the left. Mild disc bulging at L5-S1 without significant stenosis.  Lumbar radiculopathy  Lumbar facet syndrome  Sacroiliac joint dysfunction     PLAN   Continue present medications oxycodone and  Flexeril  F/U PCP Dr. Juanetta [name] for evaliation of  BP and general medical  condition  F/U surgical evaluation. Patient will follow-up with Dr. Marikay Alaravid [name] as discussed. Patient recently was evaluated by Dr. Yetta BarreJones who decided no surgery at this time and patient is to continue treatment in pain management  F/U neurological evaluation May consider PNCV EMG studies and other studies  May consider radiofrequency rhizolysis or intraspinal procedures pending response to present treatment and F/U evaluation   Patient to call Pain Management Center should patient have concerns prior to scheduled return appointment

## 2016-07-31 NOTE — Progress Notes (Signed)
Safety precautions to be maintained throughout the outpatient stay will include: orient to surroundings, keep bed in low position, maintain call bell within reach at all times, provide assistance with transfer out of bed and ambulation.  

## 2016-07-31 NOTE — Patient Instructions (Signed)
PLAN   Continue present medications oxycodone and  Flexeril  F/U PCP Dr. Juanetta Snow for evaliation of  BP and general medical  condition  F/U surgical evaluation. Patient will follow-up with Dr. Marikay Alaravid Snow as discussed. Patient recently was evaluated by Dr. Yetta Snow who decided no surgery at this time and patient is to continue treatment in pain management  F/U neurological evaluation May consider PNCV EMG studies and other studies  May consider radiofrequency rhizolysis or intraspinal procedures pending response to present treatment and F/U evaluation   Patient to call Pain Management Center should patient have concerns prior to scheduled return appointment

## 2016-08-22 ENCOUNTER — Encounter: Payer: Self-pay | Admitting: Pain Medicine

## 2016-08-22 ENCOUNTER — Ambulatory Visit: Payer: Medicare Other | Attending: Pain Medicine | Admitting: Pain Medicine

## 2016-08-22 VITALS — BP 120/80 | HR 91 | Temp 98.2°F | Resp 16 | Ht 69.0 in | Wt 185.0 lb

## 2016-08-22 DIAGNOSIS — M5116 Intervertebral disc disorders with radiculopathy, lumbar region: Secondary | ICD-10-CM | POA: Insufficient documentation

## 2016-08-22 DIAGNOSIS — M4806 Spinal stenosis, lumbar region: Secondary | ICD-10-CM | POA: Insufficient documentation

## 2016-08-22 DIAGNOSIS — F32A Depression, unspecified: Secondary | ICD-10-CM

## 2016-08-22 DIAGNOSIS — M47812 Spondylosis without myelopathy or radiculopathy, cervical region: Secondary | ICD-10-CM

## 2016-08-22 DIAGNOSIS — M1610 Unilateral primary osteoarthritis, unspecified hip: Secondary | ICD-10-CM | POA: Diagnosis not present

## 2016-08-22 DIAGNOSIS — G8929 Other chronic pain: Secondary | ICD-10-CM

## 2016-08-22 DIAGNOSIS — M47817 Spondylosis without myelopathy or radiculopathy, lumbosacral region: Secondary | ICD-10-CM

## 2016-08-22 DIAGNOSIS — M533 Sacrococcygeal disorders, not elsewhere classified: Secondary | ICD-10-CM | POA: Diagnosis not present

## 2016-08-22 DIAGNOSIS — R7303 Prediabetes: Secondary | ICD-10-CM

## 2016-08-22 DIAGNOSIS — Z9889 Other specified postprocedural states: Secondary | ICD-10-CM | POA: Diagnosis not present

## 2016-08-22 DIAGNOSIS — F329 Major depressive disorder, single episode, unspecified: Secondary | ICD-10-CM

## 2016-08-22 DIAGNOSIS — M62830 Muscle spasm of back: Secondary | ICD-10-CM | POA: Diagnosis not present

## 2016-08-22 DIAGNOSIS — M48062 Spinal stenosis, lumbar region with neurogenic claudication: Secondary | ICD-10-CM

## 2016-08-22 DIAGNOSIS — Z720 Tobacco use: Secondary | ICD-10-CM

## 2016-08-22 DIAGNOSIS — E785 Hyperlipidemia, unspecified: Secondary | ICD-10-CM

## 2016-08-22 DIAGNOSIS — M546 Pain in thoracic spine: Secondary | ICD-10-CM | POA: Diagnosis present

## 2016-08-22 DIAGNOSIS — M79606 Pain in leg, unspecified: Secondary | ICD-10-CM | POA: Diagnosis present

## 2016-08-22 DIAGNOSIS — F419 Anxiety disorder, unspecified: Secondary | ICD-10-CM

## 2016-08-22 DIAGNOSIS — M542 Cervicalgia: Secondary | ICD-10-CM | POA: Diagnosis present

## 2016-08-22 MED ORDER — CYCLOBENZAPRINE HCL 10 MG PO TABS: ORAL_TABLET | ORAL | 0 refills | Status: DC

## 2016-08-22 MED ORDER — OXYCODONE HCL 10 MG PO TABS: ORAL_TABLET | ORAL | 0 refills | Status: DC

## 2016-08-22 NOTE — Progress Notes (Signed)
    The patient is a 45 year old gentleman who returns to pain management for further evaluation and treatment of pain involving the region of the neck entire back upper and lower extremity regions. The patient is status post surgical intervention of both the cervical and lumbar regions. At the present time patient is able to perform activities of daily living as well as obtaining restful sleep without experiencing severe disabling pain. We will continue present medications consisting of Flexeril and oxycodone at this time. The patient has had improvement of his pain following interventional treatment performed in pain management. We'll continue to observe response to the present treatment regimen and patient will undergo periodic follow-up evaluation with Dr. Marikay Alaravid [name] neurosurgeon for further assessment as discussed and as well. We will remain available to consider modifications of treatment regimen as felt to be needed. All were with understanding and agreed with suggested treatment plan.    Physical examination  There was tenderness to palpation of the paraspinal musculature the cervical region cervical facet region of mild to moderate degree with well-healed surgical scar of the cervical region without increased warmth and erythema in the region scar. Palpation over the region of the acromioclavicular and glenohumeral joint regions reproduced pain of mild degree. The patient was with mild to moderate difficulty performing the drop test. The patient appeared to be with bilaterally equal grip strength without significant increase of pain with Tinel and Phalen's maneuver. Palpation over the thoracic region was with tenderness to palpation with moderate muscle spasms noted in the thoracic paraspinal musculature region without crepitus of the thoracic region noted. Palpation over the lumbar paraspinal muscular treat and lumbar facet region was with moderate to moderately severe discomfort with lateral  bending rotation extension and palpation of the lumbar facets reproducing moderate to moderately severe discomfort. There was tenderness over the region of the PSIS and PII S region a moderate degree. Palpation of the greater trochanteric region iliotibial band region was with minimal discomfort. Straight leg raising was tolerates approximately 20 without increased pain noted with dorsiflexion noted EHL strength appeared to be decreased. There appeared to be decreased sensation of the L5 dermatomal dystrophy she was negative clonus negative Homans. Abdomen was nontender with no costovertebral angle tenderness noted.     Assessment    Degenerative joint disease of hip  Sacroiliac joint dysfunction  Degenerative disc disease lumbar spine Laminectomy L2 and L3 with decompression of the central canal. 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 Moderate right and mild left foraminal stenosis at L3-4 secondary to lateral disc protrusions and facet hypertrophy. Mild subarticular and mild to moderate foraminal stenosis at L4-5 secondary to acquired and congenital factors worse on the left. Mild disc bulging at L5-S1 without significant stenosis.  Lumbar radiculopathy  Lumbar facet syndrome  Sacroiliac joint dysfunction     PLAN   Continue present medications oxycodone and  Flexeril  F/U PCP Dr. Juanetta [name] for evaliation of  BP and general medical  condition  F/U surgical evaluation. Patient will follow-up with Dr. Marikay Alaravid [name] as discussed. Patient recently was evaluated by Dr. Yetta BarreJones who decided no surgery at this time and patient is to continue treatment in pain management. Patient will follow-up with Dr. Yetta BarreJones periodically  F/U neurological evaluation May consider PNCV EMG studies and other studies  May consider radiofrequency rhizolysis or intraspinal procedures pending response to present treatment and F/U evaluation   Patient to call Pain Management Center should  patient have concerns prior to scheduled return appointment

## 2016-08-22 NOTE — Patient Instructions (Signed)
PLAN   Continue present medications oxycodone and  Flexeril  F/U PCP Dr. Juanetta [name] for evaliation of  BP and general medical  condition  F/U surgical evaluation. Patient will follow-up with Dr. Marikay Alaravid [name] as discussed. Patient recently was evaluated by Dr. Yetta BarreJones who decided no surgery at this time and patient is to continue treatment in pain management. Patient will follow-up with Dr. Yetta BarreJones periodically  F/U neurological evaluation May consider PNCV EMG studies and other studies  May consider radiofrequency rhizolysis or intraspinal procedures pending response to present treatment and F/U evaluation   Patient to call Pain Management Center should patient have concerns prior to scheduled return appointment

## 2016-08-30 ENCOUNTER — Ambulatory Visit: Payer: Medicare Other | Admitting: Pain Medicine

## 2016-09-03 ENCOUNTER — Telehealth: Payer: Self-pay | Admitting: *Deleted

## 2016-10-21 ENCOUNTER — Other Ambulatory Visit: Payer: Self-pay | Admitting: Pain Medicine

## 2016-10-22 ENCOUNTER — Ambulatory Visit: Payer: Medicare Other | Admitting: Pain Medicine

## 2016-10-29 ENCOUNTER — Encounter: Payer: Self-pay | Admitting: Family Medicine

## 2016-10-29 ENCOUNTER — Ambulatory Visit (INDEPENDENT_AMBULATORY_CARE_PROVIDER_SITE_OTHER): Payer: Medicare Other | Admitting: Family Medicine

## 2016-10-29 VITALS — BP 118/70 | HR 86 | Temp 98.2°F | Resp 16 | Ht 69.0 in | Wt 189.0 lb

## 2016-10-29 DIAGNOSIS — F418 Other specified anxiety disorders: Secondary | ICD-10-CM

## 2016-10-29 DIAGNOSIS — A630 Anogenital (venereal) warts: Secondary | ICD-10-CM | POA: Diagnosis not present

## 2016-10-29 DIAGNOSIS — G4701 Insomnia due to medical condition: Secondary | ICD-10-CM

## 2016-10-29 DIAGNOSIS — F419 Anxiety disorder, unspecified: Principal | ICD-10-CM

## 2016-10-29 DIAGNOSIS — G4700 Insomnia, unspecified: Secondary | ICD-10-CM | POA: Insufficient documentation

## 2016-10-29 DIAGNOSIS — F329 Major depressive disorder, single episode, unspecified: Secondary | ICD-10-CM

## 2016-10-29 MED ORDER — ESCITALOPRAM OXALATE 10 MG PO TABS: 10.0000 mg | ORAL_TABLET | Freq: Every day | ORAL | 0 refills | Status: DC

## 2016-10-29 NOTE — Assessment & Plan Note (Signed)
Chronic multifactorial problem Treat underlying depression/anxiety, suspect should improve insomnia

## 2016-10-29 NOTE — Assessment & Plan Note (Addendum)
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 - Significantly elevated PHQ9 / GAD scores - Limited prior treatments with bridge Xanax to Celexa, without relief  Plan: 1. Counseled on BDZ not indicated for chronic management of depression/anxiety, especially with chronic pain and opiates, caution and to avoid for risk of dependence, oversedation, abuse. 2. Start Escitalopram 10mg  daily - counseled on 4-6 weeks to max effect, briefly covered potential side effects, similar to Celexa, if no benefit after 1st 2 weeks can increase dose to 20mg  daily 3. Recommend future establish with therapy/counseling, consider Psychiatry as well if needed 4. Follow-up 4 weeks, re-evaluate PHQ/GAD, med, consider switching to Cymbalta if not effective also for chronic pain vs consider adjunct Mirtazapine (insomnia, poor appetite) Wellbutrin (pre-dominant depression) vs Buspar (GAD symptoms)

## 2016-10-29 NOTE — Progress Notes (Signed)
Subjective:    Patient ID: Mark LeffKenneth S Copher, male    DOB: [birth year], 45 y.o.   MRN: [id number]  Mark Snow is a 45 y.o. 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 No other treatments tried. Has not seen anyone for this problem since. He is interested in any treatment to remove warts - Denies penile ulcer, discharge, LAD, swelling, redness or other rash  Depression with Anxiety, Chronic / Chronic Pain Syndrome: - Reports chronic history of depression and anxiety present for past 10 years, with onset around 2007 following acute injury with fractured neck and back, he has been on disability since, and he also states that he currently lives alone - He describes his symptoms as mostly related to depression, but his anxiety involves worrying about many different things including money/finances and health mostly - He is currently not on any treatment for depression or anxiety. Previously (1-2 years ago) he was started on Xanax for 3 months taper by other provider at our office and then switched to Celexa for anxiety/depression, titrated up to max dose without significant benefit before it was discontinued, he has been off of this for >1 year, and states this was the only anti-depressant type medication he has ever tried - Prior history of seeing Psychiatry but it has been many years, no therapy or counseling recently, and he is not interested in therapy at this time - Interested in retry different medication - Admits insomnia associated with mood symptoms and pain - Admits poor appetite and reduced energy, not going to family functions - Admits cigarette  smoking, denies alcohol or other drugs - Denies any prior history of suicidal ideation or thoughts of harming others, mania symptoms, racing thoughts  Social History  Substance Use Topics  . Smoking status: Current Every Day Smoker    Packs/day: 0.75    Years: 20.00    Types: Cigarettes  . Smokeless tobacco: Current User  . Alcohol use No    Review of Systems Per HPI unless specifically indicated above   GAD 7 : Generalized Anxiety Score 10/29/2016  Nervous, Anxious, on Edge 3  Control/stop worrying 3  Worry too much - different things 2  Trouble relaxing 2  Restless 1  Easily annoyed or irritable 3  Afraid - awful might happen 0  Total GAD 7 Score 14  Anxiety Difficulty Somewhat difficult    [facility] 10/29/2016 07/09/2016 06/29/2016  Decreased Interest 3 0 0  Down, Depressed, Hopeless 3 0 0  PHQ - 2 Score 6 0 0  Altered sleeping 3 - -  Tired, decreased energy 3 - -  Change in appetite 2 - -  Feeling bad or failure about yourself  2 - -  Moving slowly or fidgety/restless 3 - -  Suicidal thoughts 0 - -  PHQ-9 Score 19 - -        Objective:    BP 118/70   Pulse 86   Temp 98.2 F (36.8 C) (Oral)   Resp 16   Ht 5\' 9"  (1.753 m)   Wt 189 lb (85.7 kg)   BMI 27.91 kg/m   Wt  Readings from Last 3 Encounters:  10/29/16 189 lb (85.7 kg)  08/22/16 185 lb (83.9 kg)  07/31/16 190 lb (86.2 kg)    Physical Exam  Constitutional: He appears well-developed and well-nourished. No distress.  Well-appearing, comfortable, cooperative  HENT:  Head: Normocephalic and atraumatic.  Mouth/Throat: Oropharynx is clear and moist.  Cardiovascular: Normal rate.   Pulmonary/Chest: Effort normal.  Neurological: He is alert.  Skin: Skin is warm and dry. He is not diaphoretic. No erythema.  Groin: - No erythema, ulceration or edema. 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  Well groomed, good eye contact, mood appears depressed and occasional tearing, normal speech and thoughts  Nursing note and vitals reviewed.     Assessment & Plan:   Problem List Items Addressed This Visit    Insomnia    Chronic multifactorial problem Treat underlying depression/anxiety, suspect should improve insomnia      Genital warts    Prior failed topical salicylic acid treatment, with recurrence Counseled on unlikely to resolve and can get recurrence with most treatment Referral to Dermatology for further evaluation and management, trial of alternative options      Relevant Orders   Ambulatory referral to Dermatology   Anxiety and depression - Primary    Concern with significant MDD with mixed GAD like symptoms, uncontrolled for >1-2 years, related to complicated prior history of acute injuries 2007 and disability, suspect multifactorial contributes to current mood disorder. - Significantly elevated PHQ9 / GAD scores - Limited prior treatments with bridge Xanax to Celexa, without relief  Plan: 1. Counseled on BDZ not indicated for chronic management of depression/anxiety, especially with chronic pain and opiates, caution and to avoid for risk of dependence, oversedation, abuse. 2. Start Escitalopram 10mg  daily - counseled on 4-6 weeks to max effect, briefly covered potential side effects, similar to Celexa, if no benefit after 1st 2 weeks can increase dose to 20mg  daily 3. Recommend future establish with therapy/counseling, consider Psychiatry as well if needed 4. Follow-up 4 weeks, re-evaluate PHQ/GAD, med, consider switching to Cymbalta if not effective also for chronic pain vs consider adjunct Mirtazapine (insomnia, poor appetite) Wellbutrin (pre-dominant depression) vs Buspar (GAD symptoms)      Relevant Medications   escitalopram (LEXAPRO) 10 MG tablet      Meds ordered this  encounter  Medications  . escitalopram (LEXAPRO) 10 MG tablet    Sig: Take 1 tablet (10 mg total) by mouth daily. If no improvement in mood/anxiety after 2 weeks, increase to 2 tablets daily (20mg )    Dispense:  45 tablet    Refill:  0      Follow up plan: Return in about 4 weeks (around 11/26/2016) for Depression/Anxiety (PHQ/GAD score).  Saralyn PilarAlexander Karamalegos, DO [facility] 10/29/2016, 10:09 PM

## 2016-10-29 NOTE — Patient Instructions (Signed)
Thank you for coming in to clinic today.  1. For Depression / Anxiety Start treatment with Escitalopram (Lexapro) 10mg  tablets, take 1 daily for next 4 weeks. As discussed most anxiety medications are also used for mood disorders such as depression, because they work on similar chemicals in your brain. It may take up to 4-6 weeks for the medicine to take full effect and for you to notice a difference, sometimes you may notice it working sooner, otherwise we may need to adjust the dose.  If you do not get any relief from your symptoms after first 2 weeks, then increase to 2 tablets (20mg ) total daily at same time for the next 2 weeks until you return to see me.  For most patients with anxiety or mood concerns, we generally recommend referral to establish with a therapist or counselor as well. This has been shown to improve the effectiveness of the medications, and in the future we may be able to taper off medications. We will discuss at future appointments.   For genital warts, referral to be placed to Dermatology (if we need to change this to Urology due to location, we will notify you of this change)  It may take a few days to weeks to get the appointment, they will call you when ready  [facility] - Dr. Jesusita OkaAna Benitez-Graham   [address], [place], [place] [address] Phone: [phone]   Please schedule a follow-up appointment with Dr. Althea CharonKaramalegos in 4 weeks to follow-up Depression/Anxiety (PHQ/GAD score)  If you have any other questions or concerns, please feel free to call the clinic or send a message through MyChart. You may also schedule an earlier appointment if necessary.  Saralyn PilarAlexander Kenslee Achorn, DO [facility]

## 2016-10-29 NOTE — Assessment & Plan Note (Signed)
Prior failed topical salicylic acid treatment, with recurrence Counseled on unlikely to resolve and can get recurrence with most treatment Referral to Dermatology for further evaluation and management, trial of alternative options

## 2016-11-26 ENCOUNTER — Ambulatory Visit: Payer: Medicare Other | Admitting: Family Medicine

## 2016-11-27 ENCOUNTER — Ambulatory Visit (INDEPENDENT_AMBULATORY_CARE_PROVIDER_SITE_OTHER): Payer: Medicare Other | Admitting: Family Medicine

## 2016-11-27 ENCOUNTER — Encounter: Payer: Self-pay | Admitting: Family Medicine

## 2016-11-27 VITALS — BP 112/69 | HR 90 | Temp 97.9°F | Resp 16 | Ht 69.0 in | Wt 186.0 lb

## 2016-11-27 DIAGNOSIS — F332 Major depressive disorder, recurrent severe without psychotic features: Secondary | ICD-10-CM | POA: Diagnosis not present

## 2016-11-27 DIAGNOSIS — G894 Chronic pain syndrome: Secondary | ICD-10-CM | POA: Diagnosis not present

## 2016-11-27 DIAGNOSIS — F411 Generalized anxiety disorder: Secondary | ICD-10-CM

## 2016-11-27 MED ORDER — DULOXETINE HCL 60 MG PO CPEP: 60.0000 mg | ORAL_CAPSULE | Freq: Every day | ORAL | 2 refills | Status: DC

## 2016-11-27 MED ORDER — DULOXETINE HCL 30 MG PO CPEP: 30.0000 mg | ORAL_CAPSULE | Freq: Every day | ORAL | 0 refills | Status: DC

## 2016-11-27 NOTE — Patient Instructions (Signed)
Thank you for coming in to clinic today.  1.  STOP taking Escitalopram, and switch to new medication Duloxetine same day.  Start treatment with Duloxetine 30mg , take 1 daily for next 1 week, then STOP and increase to new rx. After 1 week, start Duloxetine 60mg , take 1 daily for next 4 weeks  As discussed most anxiety medications are also used for mood disorders such as depression, because they work on similar chemicals in your brain. It may take up to 3-4 weeks for the medicine to take full effect and for you to notice a difference, sometimes you may notice it working sooner, otherwise we may need to adjust the dose.  This medicine will help depression, anxiety, and may help your chronic pain as well. In future do not get discouraged if you do not get 100% better by 4-6 weeks, we will most likely consider adding one other med to help this one, but can only make one change at a time.   For most patients with anxiety or mood concerns, we generally recommend referral to establish with a therapist or counselor as well. This has been shown to improve the effectiveness of the medications  PSYCHOLOGY COUNSELING / THERAPY  Self Referral (individual therapists)  1. [facility]   Address: [address], [place], [place] [address] Hours: Open today  9AM-7PM Phone: [phone]  2. Anell Barrheryl [facility], [facility]  - [facility] Address: [address], [place], [place] [address] Phone: [phone]   Self Referral (larger psych centers, more services available)  [facility]al [facility]) [place] [address], [place], [place] [address] Phone: [phone]  [facility], available walk-in 9am-4pm M-F [address] [place], [place] [address] Hours: 9am - 4pm (M-F, walk in available) [phone]  [facility]   Address: [address], [place], [place] [address] Hours: 8AM-5PM (accepts walk in to  establish) Phone: [phone]  IF YOU HAVE ANY CONCERNS FOR YOUR SAFETY, thoughts of harming yourself or others, please call any of the following:  Our office  [address] [facility] ED for assistance  [facility] 24 hours / 7 days  [phone]) [phone]  Please schedule a follow-up appointment with Dr. Althea CharonKaramalegos in 5 weeks for follow-up Depression / Anxiety  If you have any other questions or concerns, please feel free to call the clinic or send a message through MyChart. You may also schedule an earlier appointment if necessary.  Saralyn PilarAlexander Mang Hazelrigg, DO [facility]

## 2016-11-27 NOTE — Assessment & Plan Note (Addendum)
Worsening to not improved (after 4 week trial on Escitalopram, up to max dose 20mg , SSRI) severe MDD with mixed GAD symptoms, remains uncontrolled for >1-2 years, related to complicated prior history of acute injuries 2007 and disability, suspect multifactorial with chronic pain, tobacco abuse, contributes to current mood disorder. - Still elevated PHQ9 / GAD scores - Limited prior treatments with bridge Xanax to Celexa, Escitalopram  Plan: 1. Discontinue Escitalopram - same day start new SNRI 2. Start Duloxetine 30mg  daily for 1 week, then titrate up to 60mg , separate rx Duloxetine 60mg  daily given #30 with refills, discussed advantages with some improvement of chronic pain syndrome, ideally help control depression anxiety, but discussed options, may need adjunct therapy in future, but only 1 change today 3. Again recommended benefit of future Psychology therapy, since patient did not establish from last visit, given handout with names contact info. Suspect severity of patient's condition if not improved with few trials of medical therapy most definitely will need therapy 4. Return criteria if worsening, numbers for safety given if concern of suicide, patient admits he has no suicidal ideation today 5. Follow-up 4-5 weeks, re-evaluate PHQ/GAD, med, consider adjunct therapy with Mirtazapine (insomnia, poor appetite) Wellbutrin (smoking cessation, pre-dominant depression) vs Buspar (GAD symptoms)

## 2016-11-27 NOTE — Assessment & Plan Note (Signed)
Trial on Cymbalta for added benefit for chronic pain, in addition to depression/anxiety Followed by Pain Management / Anesthesia for injections

## 2016-11-27 NOTE — Assessment & Plan Note (Signed)
Secondary GAD symptoms, in setting of severe MDD. See A&P for MDD Discontinue Escitalopram, ineffective Start Duloxetine titrate up to 60mg  daily Recommend therapy Follow-up 4 weeks, may need adjunct therapy

## 2016-11-27 NOTE — Progress Notes (Signed)
Subjective:    Patient ID: Mark LeffKenneth S Symmonds, male    DOB: 1971-08-19, 45 y.o.   MRN: [id number]  Mark Snow is a 45 y.o. male presenting on 11/27/2016 for Depression (still same not improved pt refused depression screening) and Anxiety (not improved pt refused for GAD screening)  HPI   FOLLOW-UP Depression with Anxiety, Chronic / Chronic Pain Syndrome: - [facility] for same complaints with depression/anxiety, see note for background and other details, treatment started with Escitalopram 10mg  daily, self titrate instructions up to 20mg  daily given. - Today he reports overall symptoms of depression/anxiety essentially the same without improvement, see PHQ9, GAD7 scores. Symptoms make it difficult for him to function daily, he remains on physical disability since 2007 (from fall with fractured neck and back), lives at home alone, does have pet dogs, he does not have a big support system, mother lives nearby does not always visit. - Currently describes depression worse with persistent insomnia, low energy, poor appetite. Anxiety with worrying about most things, unable to "shut his mind off at night" due to "mind racing" difficulty sleeping. - Prior failure on Celexa >1 year ago, and had prior trial on Xanax that was discontinued. - Prior history of seeing Psychiatry but it has been many years, no therapy or counseling recently, he never established since last visit - Admits chronic pain worsens his mood - Admits cigarette smoking, denies alcohol or other drugs - Denies any prior history of suicidal ideation or thoughts of harming others, mania symptoms, racing thoughts  Social History  Substance Use Topics  . Smoking status: Current Every Day Smoker    Packs/day: 0.75    Years: 20.00    Types: Cigarettes  . Smokeless tobacco: Current User  . Alcohol use No    Review of Systems  Psychiatric/Behavioral: Positive for depression.   Per HPI unless specifically  indicated above   GAD 7 : Generalized Anxiety Score 11/27/2016 10/29/2016  Nervous, Anxious, on Edge 3 3  Control/stop worrying 3 3  Worry too much - different things 3 2  Trouble relaxing 3 2  Restless 1 1  Easily annoyed or irritable 3 3  Afraid - awful might happen 0 0  Total GAD 7 Score 16 14  Anxiety Difficulty Somewhat difficult Somewhat difficult    Depression screen [facility] 11/27/2016 10/29/2016 07/09/2016  Decreased Interest 3 3 0  Down, Depressed, Hopeless 3 3 0  PHQ - 2 Score 6 6 0  Altered sleeping 3 3 -  Tired, decreased energy 3 3 -  Change in appetite 3 2 -  Feeling bad or failure about yourself  2 2 -  Trouble concentrating 3 - -  Moving slowly or fidgety/restless 3 3 -  Suicidal thoughts 0 0 -  PHQ-9 Score 23 19 -  Difficult doing work/chores Very difficult - -        Objective:    BP 112/69   Pulse 90   Temp 97.9 F (36.6 C) (Oral)   Resp 16   Ht 5\' 9"  (1.753 m)   Wt 186 lb (84.4 kg)   BMI 27.47 kg/m   Wt Readings from Last 3 Encounters:  11/27/16 186 lb (84.4 kg)  10/29/16 189 lb (85.7 kg)  08/22/16 185 lb (83.9 kg)    Physical Exam  Constitutional: He appears well-developed and well-nourished. No distress.  Well-appearing, comfortable, cooperative  HENT:  Head: Normocephalic and atraumatic.  Mouth/Throat: Oropharynx is clear and moist.  Cardiovascular: Normal rate.   Pulmonary/Chest: Effort normal.  Neurological: He is alert.  Skin: Skin is warm and dry. He is not diaphoretic.  Psychiatric: His behavior is normal.  Well groomed, good eye contact, mood unchanged since last visit still appears depressed and occasional tearing, normal speech and thoughts  Nursing note and vitals reviewed.     Assessment & Plan:   Problem List Items Addressed This Visit    Major depression, recurrent (HCC) - Primary    Worsening to not improved (after 4 week trial on Escitalopram, up to max dose 20mg , SSRI) severe MDD with mixed GAD symptoms, remains  uncontrolled for >1-2 years, related to complicated prior history of acute injuries 2007 and disability, suspect multifactorial with chronic pain, tobacco abuse, contributes to current mood disorder. - Still elevated PHQ9 / GAD scores - Limited prior treatments with bridge Xanax to Celexa, Escitalopram  Plan: 1. Discontinue Escitalopram - same day start new SNRI 2. Start Duloxetine 30mg  daily for 1 week, then titrate up to 60mg , separate rx Duloxetine 60mg  daily given #30 with refills, discussed advantages with some improvement of chronic pain syndrome, ideally help control depression anxiety, but discussed options, may need adjunct therapy in future, but only 1 change today 3. Again recommended benefit of future Psychology therapy, since patient did not establish from last visit, given handout with names contact info. Suspect severity of patient's condition if not improved with few trials of medical therapy most definitely will need therapy 4. Return criteria if worsening, numbers for safety given if concern of suicide, patient admits he has no suicidal ideation today 5. Follow-up 4-5 weeks, re-evaluate PHQ/GAD, med, consider adjunct therapy with Mirtazapine (insomnia, poor appetite) Wellbutrin (smoking cessation, pre-dominant depression) vs Buspar (GAD symptoms)      Relevant Medications   DULoxetine (CYMBALTA) 30 MG capsule   DULoxetine (CYMBALTA) 60 MG capsule   GAD (generalized anxiety disorder)    Secondary GAD symptoms, in setting of severe MDD. See A&P for MDD Discontinue Escitalopram, ineffective Start Duloxetine titrate up to 60mg  daily Recommend therapy Follow-up 4 weeks, may need adjunct therapy      Relevant Medications   DULoxetine (CYMBALTA) 30 MG capsule   DULoxetine (CYMBALTA) 60 MG capsule   Chronic pain    Trial on Cymbalta for added benefit for chronic pain, in addition to depression/anxiety Followed by Pain Management / Anesthesia for injections      Relevant  Medications   DULoxetine (CYMBALTA) 30 MG capsule   DULoxetine (CYMBALTA) 60 MG capsule      Meds ordered this encounter  Medications  . DULoxetine (CYMBALTA) 30 MG capsule    Sig: Take 1 capsule (30 mg total) by mouth daily. For 1 week, then stop and increase to new rx 60mg  daily    Dispense:  7 capsule    Refill:  0  . DULoxetine (CYMBALTA) 60 MG capsule    Sig: Take 1 capsule (60 mg total) by mouth daily. Start after finished 1 week of 30mg  daily    Dispense:  30 capsule    Refill:  2      Follow up plan: Return in about 5 weeks (around 01/01/2017) for Depression, Anxiety.  Saralyn PilarAlexander Karamalegos, DO [facility] 11/27/2016, 12:57 PM

## 2017-01-01 ENCOUNTER — Ambulatory Visit: Payer: Medicare Other | Admitting: Family Medicine

## 2017-03-05 ENCOUNTER — Other Ambulatory Visit: Payer: Self-pay | Admitting: Pain Medicine

## 2018-02-10 ENCOUNTER — Encounter: Payer: Self-pay | Admitting: Nurse Practitioner

## 2018-02-10 ENCOUNTER — Other Ambulatory Visit: Payer: Self-pay

## 2018-02-10 ENCOUNTER — Ambulatory Visit (INDEPENDENT_AMBULATORY_CARE_PROVIDER_SITE_OTHER): Payer: Medicare Other | Admitting: Nurse Practitioner

## 2018-02-10 VITALS — BP 141/92 | HR 115 | Temp 98.2°F | Resp 18 | Ht 69.0 in | Wt 191.8 lb

## 2018-02-10 DIAGNOSIS — J209 Acute bronchitis, unspecified: Secondary | ICD-10-CM | POA: Diagnosis not present

## 2018-02-10 MED ORDER — PREDNISONE 50 MG PO TABS: 50.0000 mg | ORAL_TABLET | Freq: Every day | ORAL | 0 refills | Status: DC

## 2018-02-10 MED ORDER — ALBUTEROL SULFATE HFA 108 (90 BASE) MCG/ACT IN AERS: 1.0000 | INHALATION_SPRAY | Freq: Four times a day (QID) | RESPIRATORY_TRACT | 5 refills | Status: DC | PRN

## 2018-02-10 MED ORDER — BENZONATATE 100 MG PO CAPS: 100.0000 mg | ORAL_CAPSULE | Freq: Three times a day (TID) | ORAL | 0 refills | Status: DC | PRN

## 2018-02-10 NOTE — Progress Notes (Signed)
Subjective:    Patient ID: Mark Snow, male    DOB: [birth year], 47 y.o.   MRN: [id number]  Mark Snow is a 47 y.o. male presenting on 02/10/2018 for Sinus Problem (runny nose, productive cough, and chest congestion x 6 days ago)  HPI URI symptoms Pt reports onset of symptoms 6 days ago when. Pt has had rhinorrhea, sinus congestion, chest congestion, and productive cough with progressively yellow/green phlegm worst last night.  Prior phlegm had been clear.  Feels change in sinus congestion with blowing nose. - Has tried mucinex and cough syrup for last 4 days.  May have been cough and cold med with decongestant. - No sore throat, ear pressure/pain/fullness/muffled sounds. - Pt denies fever, chills, sweats, nausea, vomiting, diarrhea and constipation.    Social History   Tobacco Use  . Smoking status: Current Every Day Smoker    Packs/day: 0.75    Years: 20.00    Pack years: 15.00    Types: Cigarettes  . Smokeless tobacco: Current User  Substance Use Topics  . Alcohol use: No    Alcohol/week: 0.0 oz  . Drug use: No    Review of Systems Per HPI unless specifically indicated above     Objective:    BP (!) 141/92 (BP Location: Right Arm, Patient Position: Sitting, Cuff Size: Normal)   Pulse (!) 115   Temp 98.2 F (36.8 C) (Oral)   Resp 18   Ht 5\' 9"  (1.753 m)   Wt 191 lb 12.8 oz (87 kg)   SpO2 98%   BMI 28.32 kg/m   Wt Readings from Last 3 Encounters:  02/10/18 191 lb 12.8 oz (87 kg)  11/27/16 186 lb (84.4 kg)  10/29/16 189 lb (85.7 kg)    Physical Exam  Constitutional: He is oriented to person, place, and time. He appears well-developed and well-nourished. He appears distressed (mildly).  HENT:  Head: Normocephalic and atraumatic.  Right Ear: Hearing, tympanic membrane, external ear and ear canal normal.  Left Ear: Hearing, tympanic membrane, external ear and ear canal normal.  Nose: Mucosal edema and rhinorrhea present. Right sinus exhibits no  maxillary sinus tenderness and no frontal sinus tenderness. Left sinus exhibits no maxillary sinus tenderness and no frontal sinus tenderness.  Mouth/Throat: Uvula is midline and mucous membranes are normal. Posterior oropharyngeal edema (cobblestoning) and posterior oropharyngeal erythema (mild) present. Oropharyngeal exudate: clear secretions.  Neck: Normal range of motion. Neck supple.  Cardiovascular: Regular rhythm, S1 normal, S2 normal, normal heart sounds and intact distal pulses. Tachycardia present.  Pulmonary/Chest: Effort normal. No accessory muscle usage. No respiratory distress. He has rhonchi (clears with coughing) in the right lower field and the left lower field.  Lymphadenopathy:    He has cervical adenopathy.  Neurological: He is alert and oriented to person, place, and time.  Skin: Skin is warm and dry.  Psychiatric: He has a normal mood and affect. His behavior is normal.  Vitals reviewed.       Assessment & Plan:   Problem List Items Addressed This Visit    None    Visit Diagnoses    Acute bronchitis, unspecified organism    -  Primary   Relevant Medications   benzonatate (TESSALON) 100 MG capsule   predniSONE (DELTASONE) 50 MG tablet   albuterol (PROVENTIL HFA;VENTOLIN HFA) 108 (90 Base) MCG/ACT inhaler    Acute illness with mild URI symptoms and Bronchitis. Fever responsive to NSAIDs and tylenol.  Symptoms not worsening. Consistent with viral illness  x 6 days with known sick contacts and no identifiable focal infections of ears, nose, throat. - Pt is current smoker.  Reduced from 2 ppd to 1/2-3/4 ppd over last several months.  Plan: 1. Reassurance, likely self-limited with cough lasting up to few weeks - Start albuterol 1-2 puffs ever y6 hours as needed for wheezing or shortness of breath.   - atrovent nasal spray declined - Continue Mucinex.  Can also take dextromethorphan up to 7-10 days then stop - Start tessalon perles 100 mg every 12 hours as needed for  cough. - START prednisone 50 mg tablet once daily with breakfast x 5 days. 2. Supportive care with nasal saline, warm herbal tea with honey, 3. Improve hydration 4. Tylenol / Motrin PRN fevers 5. Return criteria given   Meds ordered this encounter  Medications  . benzonatate (TESSALON) 100 MG capsule    Sig: Take 1 capsule (100 mg total) by mouth 3 (three) times daily as needed for cough.    Dispense:  20 capsule    Refill:  0    Order Specific Question:   Supervising Provider    Answer:   Smitty Cords [[id number]]  . predniSONE (DELTASONE) 50 MG tablet    Sig: Take 1 tablet (50 mg total) by mouth daily with breakfast.    Dispense:  5 tablet    Refill:  0    Order Specific Question:   Supervising Provider    Answer:   Smitty Cords [[id number]]  . albuterol (PROVENTIL HFA;VENTOLIN HFA) 108 (90 Base) MCG/ACT inhaler    Sig: Inhale 1-2 puffs into the lungs every 6 (six) hours as needed for wheezing or shortness of breath.    Dispense:  1 Inhaler    Refill:  5    Order Specific Question:   Supervising Provider    Answer:   Smitty Cords [[id number]]     Follow up plan: Return 2-5 days if symptoms worsen or fail to improve.   Wilhelmina Mcardle, DNP, AGPCNP-BC Adult Gerontology Primary Care Nurse Practitioner [facility] 02/10/2018, 2:37 PM

## 2018-02-10 NOTE — Patient Instructions (Addendum)
Iantha FallenKenneth, Thank you for coming in to clinic today.  1. It sounds like you have a Upper Respiratory Virus with bronchitis - this will most likely run it's course in 7 to 10 days. Recommend good hand washing. - If congestion is worse, start OTC Mucinex (or may try Mucinex-DM for cough) up to 7-10 days then stop - Drink plenty of fluids to improve congestion - You may try over the counter Nasal Saline spray (Simply Saline, Ocean Spray) as needed to reduce congestion. - Drink warm herbal tea with honey for sore throat. - Start taking Tylenol extra strength 1 to 2 tablets every 6-8 hours for aches or fever/chills for next few days as needed.  Do not take more than 3,000 mg in 24 hours from all medicines.  May take Ibuprofen as well if tolerated 200-400mg  every 8 hours as needed. - START benzonatate 100 mg capusle three times dialy as needed for cough - START albuterol 1-2 puffs every 6 hours as needed for shortness of breath, wheezing.  If symptoms significantly worsening with persistent fevers/chills despite tylenol/ibpurofen, nausea, vomiting unable to tolerate food/fluids or medicine, body aches, or shortness of breath, sinus pain pressure or worsening productive cough, then follow-up for re-evaluation, may seek more immediate care at Urgent Care or ED if more concerned for emergency.  Please schedule a follow-up appointment with Wilhelmina McardleLauren Tenia Goh, AGNP. Return 2-5 days if symptoms worsen or fail to improve.  If you have any other questions or concerns, please feel free to call the clinic or send a message through MyChart. You may also schedule an earlier appointment if necessary.  You will receive a survey after today's visit either digitally by e-mail or paper by [employer] mail. Your experiences and feedback matter to us.  Please respond so we know how we are doing as we provide care for you.   Wilhelmina McardleLauren Sherrel Shafer, DNP, AGNP-BC Adult Gerontology Nurse Practitioner [facility], [facility]

## 2018-02-13 ENCOUNTER — Telehealth: Payer: Self-pay | Admitting: Nurse Practitioner

## 2018-02-13 DIAGNOSIS — J209 Acute bronchitis, unspecified: Secondary | ICD-10-CM

## 2018-02-13 MED ORDER — AZITHROMYCIN 250 MG PO TABS: ORAL_TABLET | ORAL | 0 refills | Status: DC

## 2018-02-13 NOTE — Telephone Encounter (Signed)
Please notify patient I have sent in rx antibiotic [facility]. He was seen by Leotis ShamesLauren recently, I have discussed case with her and reviewed chart.  Saralyn PilarAlexander Deysha Cartier, DO [facility] 02/13/2018, 12:42 PM

## 2018-02-13 NOTE — Telephone Encounter (Signed)
Incoming call

## 2018-02-13 NOTE — Telephone Encounter (Signed)
Pt is not feeling any better, productive cough with fever.  He asked to have antibiotics called into [facility].  His number is [phone]

## 2018-02-13 NOTE — Telephone Encounter (Signed)
Pt.notified

## 2018-02-17 ENCOUNTER — Telehealth: Payer: Self-pay | Admitting: Nurse Practitioner

## 2018-02-17 NOTE — Telephone Encounter (Signed)
Azithromycin will continue working for an additional 3 days.  If it is bacterial, the antibiotic is still working.  Most often, bronchitis is viral and is not going to be improved on antibiotics.  Often, people can have a cough from bronchitis for up to 3 months.  Be patient.  Continue using supportive therapies.

## 2018-02-17 NOTE — Telephone Encounter (Signed)
The pt states he took his last abx today and he's still coughing and it's productive. He states that he have coughed so much that it hurts in his ribs. Please advise

## 2018-02-17 NOTE — Telephone Encounter (Signed)
Pt finished the antibiotics but still has a productive cough.  His call back number is [phone]

## 2018-02-17 NOTE — Telephone Encounter (Signed)
Attempted to contact the pt to triage, no answer. LMOM to return my call.

## 2018-02-18 ENCOUNTER — Other Ambulatory Visit: Payer: Self-pay | Admitting: Nurse Practitioner

## 2018-02-18 DIAGNOSIS — J209 Acute bronchitis, unspecified: Secondary | ICD-10-CM

## 2018-02-18 DIAGNOSIS — J22 Unspecified acute lower respiratory infection: Secondary | ICD-10-CM

## 2018-02-18 MED ORDER — HYDROCOD POLST-CPM POLST ER 10-8 MG/5ML PO SUER: 5.0000 mL | Freq: Every evening | ORAL | 0 refills | Status: AC | PRN

## 2018-02-18 MED ORDER — BENZONATATE 100 MG PO CAPS: 100.0000 mg | ORAL_CAPSULE | Freq: Three times a day (TID) | ORAL | 0 refills | Status: DC | PRN

## 2018-02-18 MED ORDER — CEFTRIAXONE SODIUM 1 G IJ SOLR: 1.0000 g | Freq: Once | INTRAMUSCULAR | Status: DC

## 2018-02-18 NOTE — Progress Notes (Signed)
Pt continues to have severe productive cough despite azithromycin doses completed yesterday.  Azithromycin is still active today per drug half-life.  Coughing has now prevented sleep for several days.  Has very achy and sore rib cage/muscles.  Pt states he has significant difficulty creating a cough 2/2 pain.   Plan: 1. Administer rocephin 1 g IM today in clinic to maximize antibiotic therapy to treat presumed CAP. 2. Add Tussionex 5 mL at bedtime as needed for next 10 days for cough. 3. Continue benzonatate 100 mg 1 capsule tid prn cough. 4. Followup in clinic if productive cough not resolving in next 7-10 days.  May need chest Xray and possible treatment with levaquin if not improving.

## 2018-09-17 ENCOUNTER — Emergency Department
Admission: EM | Admit: 2018-09-17 | Discharge: 2018-09-17 | Disposition: A | Discharge status: Home / Self Care | Payer: Medicare Other | Attending: Emergency Medicine | Admitting: Emergency Medicine

## 2018-09-17 ENCOUNTER — Encounter: Payer: Self-pay | Admitting: Emergency Medicine

## 2018-09-17 DIAGNOSIS — L03116 Cellulitis of left lower limb: Secondary | ICD-10-CM

## 2018-09-17 DIAGNOSIS — N189 Chronic kidney disease, unspecified: Secondary | ICD-10-CM | POA: Diagnosis not present

## 2018-09-17 DIAGNOSIS — F17210 Nicotine dependence, cigarettes, uncomplicated: Secondary | ICD-10-CM | POA: Insufficient documentation

## 2018-09-17 DIAGNOSIS — R2242 Localized swelling, mass and lump, left lower limb: Secondary | ICD-10-CM | POA: Diagnosis present

## 2018-09-17 DIAGNOSIS — Z79899 Other long term (current) drug therapy: Secondary | ICD-10-CM | POA: Insufficient documentation

## 2018-09-17 MED ORDER — LIDOCAINE HCL (PF) 1 % IJ SOLN
INTRAMUSCULAR | Status: AC
  Administered 2018-09-17: 5 mL via INTRADERMAL
  Filled 2018-09-17: qty 5

## 2018-09-17 MED ORDER — CLINDAMYCIN HCL 300 MG PO CAPS: 300.0000 mg | ORAL_CAPSULE | Freq: Three times a day (TID) | ORAL | 0 refills | Status: AC

## 2018-09-17 MED ORDER — LIDOCAINE HCL (PF) 1 % IJ SOLN
5.0000 mL | Freq: Once | INTRAMUSCULAR | Status: AC
  Administered 2018-09-17: 5 mL via INTRADERMAL
  Filled 2018-09-17: qty 5

## 2018-09-17 NOTE — ED Provider Notes (Signed)
[facility] Emergency Department Provider Note  ____________________________________________  Time seen: Approximately 6:46 PM  I have reviewed the triage vital signs and the nursing notes.   HISTORY  Chief Complaint Insect Bite   HPI Mark Snow is a 47 y.o. male who presents to the emergency department for treatment and evaluation of lesion on the left ankle. He noticed the area 3 days ago and believes it is a spider bite. No known fever. History of MRSA. No alleviating measures prior to arrival.   Past Medical History:  Diagnosis Date  . Anxiety   . Arthritis   . Back pain   . Back pain, chronic   . Chronic kidney disease   . Depression   . Genital warts   . HLD (hyperlipidemia)   . Insomnia   . MRSA (methicillin resistant staph aureus) culture positive   . Neck pain, chronic     Patient Active Problem List   Diagnosis Date Noted  . GAD (generalized anxiety disorder) 11/27/2016  . Insomnia 10/29/2016  . Genital warts 10/29/2016  . Major depression, recurrent (HCC) 03/19/2016  . Borderline diabetes mellitus 03/19/2016  . Chronic pain 03/19/2016  . HLD (hyperlipidemia) 03/19/2016  . Current tobacco use 03/19/2016  . DDD (degenerative disc disease), lumbar 05/12/2015  . DDD (degenerative disc disease), cervical 05/12/2015  . Cervical facet syndrome 05/12/2015  . Lumbosacral facet joint syndrome 05/12/2015  . Spinal stenosis, lumbar region, with neurogenic claudication 05/12/2015    Past Surgical History:  Procedure Laterality Date  . BACK SURGERY  2009,2013   Dr. Yetta Barre  . INCISION AND DRAINAGE ABSCESS  03/22/14   buttock  . NECK SURGERY  2007,2010    Prior to Admission medications   Medication Sig Start Date End Date Taking? Authorizing Provider  albuterol (PROVENTIL HFA;VENTOLIN HFA) 108 (90 Base) MCG/ACT inhaler Inhale 1-2 puffs into the lungs every 6 (six) hours as needed for wheezing or shortness of breath. 02/10/18   Galen Manila, NP  azithromycin (ZITHROMAX Z-PAK) 250 MG tablet Take 2 tabs (500mg  total) on Day 1. Take 1 tab (250mg ) daily for next 4 days. 02/13/18   Karamalegos, Netta Neat, DO  benzonatate (TESSALON) 100 MG capsule Take 1 capsule (100 mg total) by mouth 3 (three) times daily as needed for cough. 02/18/18   Galen Manila, NP  clindamycin (CLEOCIN) 300 MG capsule Take 1 capsule (300 mg total) by mouth 3 (three) times daily for 10 days. 09/17/18 09/27/18  Burel Kahre, Kasandra Knudsen, FNP  cyclobenzaprine (FLEXERIL) 10 MG tablet Limit 1 tablet by mouth per day or twice a day to 4 times a day if tolerated 08/22/16   Ewing Schlein, MD  DULoxetine (CYMBALTA) 60 MG capsule Take 1 capsule (60 mg total) by mouth daily. Start after finished 1 week of 30mg  daily Patient not taking: Reported on 02/10/2018 11/27/16   Smitty Cords, DO  gabapentin (NEURONTIN) 300 MG capsule  02/03/18   [provider]  Oxycodone HCl 10 MG TABS Limit 4 - 8  tabs by mouth per day if tolerated ( DISCONTINUE USE OF [facility] AND DO NOT WEAR  FENTANYL PATCH) 08/22/16   Ewing Schlein, MD    Allergies No known allergies  Family History  Problem Relation Age of Onset  . Lung cancer Father   . Cancer Father     Social History Social History   Tobacco Use  . Smoking status: Current Every Day Smoker    Packs/day: 0.75  Years: 20.00    Pack years: 15.00    Types: Cigarettes  . Smokeless tobacco: Current User  Substance Use Topics  . Alcohol use: No    Alcohol/week: 0.0 standard drinks  . Drug use: No    Review of Systems  Constitutional: Negative for fever. Respiratory: Negative for cough or shortness of breath.  Musculoskeletal: Negative for myalgias Skin: Positive for lesion on the left ankle. Neurological: Negative for numbness or paresthesias. ____________________________________________   PHYSICAL EXAM:  VITAL SIGNS: ED Triage Vitals  Enc Vitals Group     BP 09/17/18 1841 (!) 148/92      Pulse Rate 09/17/18 1841 90     Resp --      Temp 09/17/18 1841 98.5 F (36.9 C)     Temp Source 09/17/18 1841 Oral     SpO2 09/17/18 1841 98 %     Weight 09/17/18 1819 195 lb (88.5 kg)     Height 09/17/18 1819 5\' 9"  (1.753 m)     Head Circumference --      Peak Flow --      Pain Score 09/17/18 1819 3     Pain Loc --      Pain Edu? --      Excl. in GC? --      Constitutional: Well appearing. Eyes: Conjunctivae are clear without discharge or drainage. Nose: No rhinorrhea noted. Mouth/Throat: Airway is patent.  Neck: No stridor. Unrestricted range of motion observed. Cardiovascular: Capillary refill is <3 seconds.  Respiratory: Respirations are even and unlabored.. Musculoskeletal: Unrestricted range of motion observed. Neurologic: Awake, alert, and oriented x 4.  Skin:  2cm indurated, slightly fluctuant erythematous lesion on the left anterior ankle with surrounding edema and erythema.  ____________________________________________   LABS (all labs ordered are listed, but only abnormal results are displayed)  Labs Reviewed - No data to display ____________________________________________  EKG  Not indicated. ____________________________________________  RADIOLOGY  Not indicated. ____________________________________________   PROCEDURES  .Marland KitchenIncision and Drainage Date/Time: 09/17/2018 7:22 PM Performed by: Chinita Pester, FNP Authorized by: Chinita Pester, FNP   Consent:    Consent obtained:  Verbal   Consent given by:  Patient   Risks discussed:  Bleeding, infection, incomplete drainage and pain   Alternatives discussed:  Alternative treatment, delayed treatment and observation Location:    Type:  Abscess   Location:  Lower extremity Pre-procedure details:    Skin preparation:  Betadine Anesthesia (see MAR for exact dosages):    Anesthesia method:  Local infiltration   Local anesthetic:  Lidocaine 1% w/o epi Procedure type:    Complexity:   Simple Procedure details:    Incision types:  Single straight   Incision depth:  Dermal   Scalpel blade:  11   Techniques: probed.   Drainage:  Bloody   Wound treatment:  Wound left open   Packing materials:  None Post-procedure details:    Patient tolerance of procedure:  Tolerated well, no immediate complications   ____________________________________________   INITIAL IMPRESSION / ASSESSMENT AND PLAN / ED COURSE  Mark Snow is a 47 y.o. male who presents to the emergency department for treatment and evaluation of an abscess and surrounding cellulitis to the left anterior ankle that is been present for the past 3 days.  The patient has not had any fever or other symptoms of concern.  The area is very mildly fluctuant and most likely will not contained purulent drainage at this point.  The patient requested incision and  drainage despite discussing this likelihood.  The patient feels that it in fact will have purulent drainage and requests to have the procedure.  ----------------------------------------- 7:24 PM on 09/17/2018 -----------------------------------------  Bloody drainage returned after incision.  No purulent drainage was expressed or removed.  A sterile bandage and Ace was applied over the area with mild compression to help control bleeding and to provide some support to the area.  Patient was encouraged to follow-up with his primary care provider in 2 to 3 days, especially if he has not had any improvement.  He was also advised that he will need to elevate his foot as often as possible throughout the day.  He is to take his prescribed chronic pain medication and add ibuprofen if needed.  He was advised to return to the emergency department for symptoms of change or worsen if he is unable to schedule an appointment with primary care.   Medications  lidocaine (PF) (XYLOCAINE) 1 % injection 5 mL (has no administration in time range)  lidocaine (PF) (XYLOCAINE) 1 %  injection (has no administration in time range)     Pertinent labs & imaging results that were available during my care of the patient were reviewed by me and considered in my medical decision making (see chart for details).  ____________________________________________   FINAL CLINICAL IMPRESSION(S) / ED DIAGNOSES  Final diagnoses:  Cellulitis of left lower extremity    ED Discharge Orders         Ordered    clindamycin (CLEOCIN) 300 MG capsule  3 times daily     09/17/18 1921           Note:  This document was prepared using Dragon voice recognition software and may include unintentional dictation errors.    Chinita Pester, FNP 09/17/18 Serena Croissant    Phineas Semen, MD 09/17/18 [phone]

## 2018-09-17 NOTE — ED Triage Notes (Signed)
Patient presents to the ED with red round wound to left ankle x 3 days.  Patient states he believes area is a spider bite.  Patient reports increased redness and swelling to area.

## 2018-09-17 NOTE — ED Notes (Signed)
See triage note  Presents with a red area to left ankle  States he noticed area about 3 days ago   Afebrile on arrival

## 2018-09-17 NOTE — Discharge Instructions (Signed)
Please elevate your foot often as possible throughout the day. See your PCP if not improving over the next 2-3 days. Return to the ER for symptoms that change or worsen if unable to schedule an appointment.

## 2018-09-23 ENCOUNTER — Ambulatory Visit (INDEPENDENT_AMBULATORY_CARE_PROVIDER_SITE_OTHER): Payer: Medicare Other | Admitting: Nurse Practitioner

## 2018-09-23 ENCOUNTER — Encounter: Payer: Self-pay | Admitting: Nurse Practitioner

## 2018-09-23 ENCOUNTER — Other Ambulatory Visit: Payer: Self-pay

## 2018-09-23 VITALS — BP 118/74 | HR 84 | Temp 98.1°F | Ht 69.0 in | Wt 188.8 lb

## 2018-09-23 DIAGNOSIS — Z23 Encounter for immunization: Secondary | ICD-10-CM

## 2018-09-23 DIAGNOSIS — S90562D Insect bite (nonvenomous), left ankle, subsequent encounter: Secondary | ICD-10-CM | POA: Diagnosis not present

## 2018-09-23 DIAGNOSIS — L0291 Cutaneous abscess, unspecified: Secondary | ICD-10-CM | POA: Diagnosis not present

## 2018-09-23 DIAGNOSIS — W57XXXD Bitten or stung by nonvenomous insect and other nonvenomous arthropods, subsequent encounter: Secondary | ICD-10-CM

## 2018-09-23 MED ORDER — AMOXICILLIN-POT CLAVULANATE 875-125 MG PO TABS: 1.0000 | ORAL_TABLET | Freq: Two times a day (BID) | ORAL | 0 refills | Status: AC

## 2018-09-23 NOTE — Patient Instructions (Addendum)
Shela Leff,   Thank you for coming in to clinic today.  1. Continue Clindamycin until it is finished. 2. START Augmentin 875-125 mg one tablet twice daily for 10 days.  3. Call clinic for possible repeat incision and drainage if worsening by next Tuesday or in next 10-14 days if no change.  Please schedule a follow-up appointment with Wilhelmina Mcardle, AGNP. Return if symptoms worsen or fail to improve.  If you have any other questions or concerns, please feel free to call the clinic or send a message through MyChart. You may also schedule an earlier appointment if necessary.  You will receive a survey after today's visit either digitally by e-mail or paper by [employer]. Your experiences and feedback matter to [place].  Please respond so we know how we are doing as we provide care for you.   Wilhelmina Mcardle, DNP, AGNP-BC Adult Gerontology Nurse Practitioner [facility], [facility]

## 2018-09-23 NOTE — Progress Notes (Signed)
   Subjective:    Patient ID: Mark Snow, male    DOB: [birth year], 47 y.o.   MRN: [id number]  Mark Snow is a 47 y.o. male presenting on 09/23/2018 for Insect Bite (lesion on the left ankle, possible a spider bite )   HPI Insect Bite Patient has lesion on medial left ankle he believes is a spider bite.  Appeared initially 1 week ago.  Had I&D at ED on 09/17/18.  Has not fully resolved as expected.  Social History   Tobacco Use  . Smoking status: Current Every Day Smoker    Packs/day: 0.75    Years: 20.00    Pack years: 15.00    Types: Cigarettes  . Smokeless tobacco: Current User  Substance Use Topics  . Alcohol use: No    Alcohol/week: 0.0 standard drinks  . Drug use: No    Review of Systems Per HPI unless specifically indicated above     Objective:    BP 118/74 (BP Location: Right Arm, Patient Position: Sitting, Cuff Size: Large)   Pulse 84   Temp 98.1 F (36.7 C) (Oral)   Ht 5\' 9"  (1.753 m)   Wt 188 lb 12.8 oz (85.6 kg)   BMI 27.88 kg/m   Wt Readings from Last 3 Encounters:  09/23/18 188 lb 12.8 oz (85.6 kg)  09/17/18 195 lb (88.5 kg)  02/10/18 191 lb 12.8 oz (87 kg)    Physical Exam  Constitutional: He is oriented to person, place, and time. He appears well-developed and well-nourished. No distress.  HENT:  Head: Normocephalic and atraumatic.  Cardiovascular: Normal rate and regular rhythm.  Pulmonary/Chest: Effort normal and breath sounds normal.  Neurological: He is alert and oriented to person, place, and time.  Skin: Skin is warm and dry.  Psychiatric: He has a normal mood and affect. His behavior is normal.  Vitals reviewed.    Results for orders placed or performed in visit on 05/02/16  ToxASSURE Select 13 (MW), Urine  Result Value Ref Range   ToxAssure Select 13 FINAL       Assessment & Plan:   Problem List Items Addressed This Visit    None    Visit Diagnoses    Insect bite of left ankle, subsequent encounter    -  Primary   Abscess       Needs flu shot       Relevant Orders   Flu Vaccine QUAD 6+ mos PF IM (Fluarix Quad PF) (Completed)    #  Abscess of left ankle. I&D partially successful, but lesion remains. - Complete clindamycin - START Augmentin 875-125 mg one tablet every 12 hours for 10 days.  - If no improvement in 10-14 days may need to have repeat I&D.  # Pt < age 53.  Needs annual influenza vaccine.  Plan: 1. Administer Quad flu vaccine.   Meds ordered this encounter  Medications  . amoxicillin-clavulanate (AUGMENTIN) 875-125 MG tablet    Sig: Take 1 tablet by mouth 2 (two) times daily for 10 days.    Dispense:  20 tablet    Refill:  0    Order Specific Question:   Supervising Provider    Answer:   Smitty Cords [[id number]]    Follow up plan: Return if symptoms worsen or fail to improve.  Wilhelmina Mcardle, DNP, AGPCNP-BC Adult Gerontology Primary Care Nurse Practitioner [facility] 09/23/2018, 2:27 PM

## 2018-10-02 ENCOUNTER — Ambulatory Visit (INDEPENDENT_AMBULATORY_CARE_PROVIDER_SITE_OTHER): Payer: Medicare Other | Admitting: Orthopaedic Surgery

## 2018-10-02 ENCOUNTER — Encounter (INDEPENDENT_AMBULATORY_CARE_PROVIDER_SITE_OTHER): Payer: Self-pay | Admitting: Orthopaedic Surgery

## 2018-10-02 DIAGNOSIS — M25551 Pain in right hip: Secondary | ICD-10-CM

## 2018-10-02 NOTE — Progress Notes (Signed)
Office Visit Note   Patient: Mark Snow           Date of Birth: [birth year]           MRN: [id number] Visit Date: 10/02/2018              Requested by: Tia Alert, MD [address] [place], [place] [address] PCP: Smitty Cords, DO   Assessment & Plan: Visit Diagnoses:  1. Pain of right hip joint     Plan: Based on his clinical exam and x-ray findings this does not seem to be a true hip issue.  He has no pain in the groin at all and he has normal-appearing x-rays.  I do not feel an MRI is necessary of the hip because it does feel like his hip abductors are normal as well and he has no pain over the tip of the greater trochanteric area or issues with stressing the hip in general.  Most likely this is still a lumbar spine issue.  All question concerns were answered and addressed.  Follow be as needed.  Follow-Up Instructions: Return if symptoms worsen or fail to improve.   Orders:  No orders of the defined types were placed in this encounter.  No orders of the defined types were placed in this encounter.     Procedures: No procedures performed   Clinical Data: No additional findings.   Subjective: Chief Complaint  Patient presents with  . Right Hip - Pain  Patient is a very pleasant 47 year old gentleman sent from Dr. Marikay Alar from neurosurgery to evaluate his right hip.  He is been having a lot of pain around his right hip but he points to the sciatic area around the side of his hip and not in his groin is source of his pain but it does radiate in his thigh area as well.  He does have a history of lumbar spine surgery in the past as well as a fracture of the L3 vertebral body.  He does have x-rays on the canopy system his hips for me to review.  He denies any groin pain.  He says if he is been standing for long period time and he actually takes pressure off that hip it hurts worse.  HPI  Review of Systems He currently denies any  chest pain or shortness of breath.  He denies any fever or chills or nausea or vomiting.  Objective: Vital Signs: There were no vitals taken for this visit.  Physical Exam He is alert and oriented x3 and in no acute distress Ortho Exam He does walk with a gait that has been favoring his right side.  I can put his hip to internal extra rotation with no pain in the groin at all.  He does withdraw some from the pain when rotating his hip.  There is no pain of the trochanteric area of the iliotibial band at all.  There is no weakness with hip abductors. Specialty Comments:  No specialty comments available.  Imaging: No results found. Independent and thorough review of x-rays of his pelvis and right hip show no acute findings.  I do feel the joint space is well-maintained.  PMFS History: Patient Active Problem List   Diagnosis Date Noted  . GAD (generalized anxiety disorder) 11/27/2016  . Insomnia 10/29/2016  . Genital warts 10/29/2016  . Major depression, recurrent (HCC) 03/19/2016  . Borderline diabetes mellitus 03/19/2016  . Chronic pain 03/19/2016  .  HLD (hyperlipidemia) 03/19/2016  . Current tobacco use 03/19/2016  . DDD (degenerative disc disease), lumbar 05/12/2015  . DDD (degenerative disc disease), cervical 05/12/2015  . Cervical facet syndrome 05/12/2015  . Lumbosacral facet joint syndrome 05/12/2015  . Spinal stenosis, lumbar region, with neurogenic claudication 05/12/2015   Past Medical History:  Diagnosis Date  . Anxiety   . Arthritis   . Back pain   . Back pain, chronic   . Chronic kidney disease   . Depression   . Genital warts   . HLD (hyperlipidemia)   . Insomnia   . MRSA (methicillin resistant staph aureus) culture positive   . Neck pain, chronic     Family History  Problem Relation Age of Onset  . Lung cancer Father   . Cancer Father     Past Surgical History:  Procedure Laterality Date  . BACK SURGERY  2009,2013   Dr. Yetta Barre  . INCISION AND  DRAINAGE ABSCESS  03/22/14   buttock  . NECK SURGERY  2007,2010   Social History   Occupational History  . Not on file  Tobacco Use  . Smoking status: Current Every Day Smoker    Packs/day: 0.75    Years: 20.00    Pack years: 15.00    Types: Cigarettes  . Smokeless tobacco: Current User  Substance and Sexual Activity  . Alcohol use: No    Alcohol/week: 0.0 standard drinks  . Drug use: No  . Sexual activity: Not on file

## 2018-11-14 ENCOUNTER — Encounter: Payer: Self-pay | Admitting: Nurse Practitioner

## 2018-12-05 ENCOUNTER — Other Ambulatory Visit: Payer: Self-pay

## 2018-12-05 ENCOUNTER — Emergency Department
Admission: EM | Admit: 2018-12-05 | Discharge: 2018-12-05 | Discharge status: Against Medical Advice | Payer: Medicare Other | Attending: Emergency Medicine | Admitting: Emergency Medicine

## 2018-12-05 DIAGNOSIS — F17228 Nicotine dependence, chewing tobacco, with other nicotine-induced disorders: Secondary | ICD-10-CM | POA: Diagnosis not present

## 2018-12-05 DIAGNOSIS — L02215 Cutaneous abscess of perineum: Secondary | ICD-10-CM

## 2018-12-05 DIAGNOSIS — L0291 Cutaneous abscess, unspecified: Secondary | ICD-10-CM | POA: Diagnosis present

## 2018-12-05 DIAGNOSIS — F17210 Nicotine dependence, cigarettes, uncomplicated: Secondary | ICD-10-CM | POA: Diagnosis not present

## 2018-12-05 DIAGNOSIS — Z5320 Procedure and treatment not carried out because of patient's decision for unspecified reasons: Secondary | ICD-10-CM | POA: Insufficient documentation

## 2018-12-05 DIAGNOSIS — Z79899 Other long term (current) drug therapy: Secondary | ICD-10-CM | POA: Insufficient documentation

## 2018-12-05 DIAGNOSIS — L03315 Cellulitis of perineum: Secondary | ICD-10-CM | POA: Insufficient documentation

## 2018-12-05 MED ORDER — CLINDAMYCIN HCL 300 MG PO CAPS: 600.0000 mg | ORAL_CAPSULE | Freq: Three times a day (TID) | ORAL | 0 refills | Status: AC

## 2018-12-05 MED ORDER — SULFAMETHOXAZOLE-TRIMETHOPRIM 800-160 MG PO TABS: 2.0000 | ORAL_TABLET | Freq: Two times a day (BID) | ORAL | 0 refills | Status: DC

## 2018-12-05 MED ORDER — CLINDAMYCIN HCL 150 MG PO CAPS
600.0000 mg | ORAL_CAPSULE | Freq: Once | ORAL | Status: AC
  Administered 2018-12-05: 600 mg via ORAL
  Filled 2018-12-05: qty 4

## 2018-12-05 MED ORDER — CEPHALEXIN 500 MG PO CAPS: 500.0000 mg | ORAL_CAPSULE | Freq: Four times a day (QID) | ORAL | 0 refills | Status: DC

## 2018-12-05 MED ORDER — CLINDAMYCIN HCL 300 MG PO CAPS: 600.0000 mg | ORAL_CAPSULE | Freq: Three times a day (TID) | ORAL | 0 refills | Status: DC

## 2018-12-05 MED ORDER — CEPHALEXIN 500 MG PO CAPS
500.0000 mg | ORAL_CAPSULE | Freq: Once | ORAL | Status: AC
  Administered 2018-12-05: 500 mg via ORAL
  Filled 2018-12-05: qty 1

## 2018-12-05 MED ORDER — SULFAMETHOXAZOLE-TRIMETHOPRIM 800-160 MG PO TABS
2.0000 | ORAL_TABLET | Freq: Once | ORAL | Status: AC
  Administered 2018-12-05: 2 via ORAL
  Filled 2018-12-05: qty 2

## 2018-12-05 NOTE — Discharge Instructions (Signed)
As we discussed, I am very concerned about the developing infection around your scrotum and your anus, the region of the body noticed the perineum.  This kind of infection could develop rapidly into a condition called Fournier's gangrene which can lead to extensive surgery, loss of tissue including surgical removal of your genitals, and even death.  You are refusing additional evaluation and treatment tonight and are leaving against medical advice due to not having a ride or anyone else with you, but it is absolutely necessary that you seek additional treatment as soon as possible.  Please return to the emergency department or the closest emergency department to you as soon as you are able, or follow-up with Dr. Lemar LivingsByrnett or one of his colleagues.  In the meantime, please take the prescribed medications.  You were given a first dose of each medical in the Emergency Department.

## 2018-12-05 NOTE — ED Notes (Signed)
Unable to get discharge vital signs due to pt wanting to leave.

## 2018-12-05 NOTE — ED Notes (Addendum)
Pt given strict instructions by York CeriseForbach MD to return in the morning as soon as possible due to the fact that there might be major consequences from leaving AMA. MD York CeriseForbach instructed pt that he needs to checked tonight and would not be in his best interest to leave without antibiotics. Pt however, stated that he would like to leave and would be back in the morning due to the fact that he would have a ride and pt instructed that he would be leaving AMA. Pt willingly signed AMA form and given instructions and antibiotics in case pt is not able to come back in morning.

## 2018-12-05 NOTE — ED Notes (Signed)
Pt stated that he would rather come back tomorrow and get all blood work and testing done tomorrow due to the fact that he does not have a way to get home tonight and would feel more comfortable with someone with him in case he is not able to drive home. MD notified.

## 2018-12-05 NOTE — ED Triage Notes (Addendum)
Pt with abscess noted to posterior scrotum approx size of orange. Pt states has been progressively getting bigger over last several days. Abscess extends to left buttock. Pt states last pm was draining "creamy bloody stuff".

## 2018-12-05 NOTE — ED Provider Notes (Signed)
[facility] Emergency Department Provider Note  ____________________________________________   First MD Initiated Contact with Patient 12/05/18 2313     (approximate)  I have reviewed the triage vital signs and the nursing notes.   HISTORY  Chief Complaint Abscess    HPI Mark Snow is a 47 y.o. male with medical issues as listed below which according to his verbal history also includes a perineal abscess requiring surgical drainage by Dr. Lemar Livings about 5 years ago.  He presents tonight for evaluation of gradually worsening swelling, pain, and redness of the perineum over the last 2 to 3 days.  He reports that he initially had a small pimple that he squeezed and got out a little bit of pus, but afterwards it began swelling and becoming more more painful.  Earlier today he soaked in a hot bath and he says that some bloody pus came out.  The pain is severe and walking and pressing on the area make it worse.  He denies fever/chills, chest pain, shortness of breath, nausea, vomiting, abdominal pain, and dysuria.  He is concerned that he needs to have the area drained again.  He endorses cigarette smoking and denies alcohol and drug use.  He also denies having diabetes.  Past Medical History:  Diagnosis Date  . Anxiety   . Arthritis   . Back pain   . Back pain, chronic   . Chronic kidney disease   . Depression   . Genital warts   . HLD (hyperlipidemia)   . Insomnia   . MRSA (methicillin resistant staph aureus) culture positive   . Neck pain, chronic     Patient Active Problem List   Diagnosis Date Noted  . GAD (generalized anxiety disorder) 11/27/2016  . Insomnia 10/29/2016  . Genital warts 10/29/2016  . Major depression, recurrent (HCC) 03/19/2016  . Borderline diabetes mellitus 03/19/2016  . Chronic pain 03/19/2016  . HLD (hyperlipidemia) 03/19/2016  . Current tobacco use 03/19/2016  . DDD (degenerative disc disease), lumbar 05/12/2015  .  DDD (degenerative disc disease), cervical 05/12/2015  . Cervical facet syndrome 05/12/2015  . Lumbosacral facet joint syndrome 05/12/2015  . Spinal stenosis, lumbar region, with neurogenic claudication 05/12/2015    Past Surgical History:  Procedure Laterality Date  . BACK SURGERY  2009,2013   Dr. Yetta Barre  . INCISION AND DRAINAGE ABSCESS  03/22/14   buttock  . NECK SURGERY  2007,2010    Prior to Admission medications   Medication Sig Start Date End Date Taking? Authorizing Provider  cephALEXin (KEFLEX) 500 MG capsule Take 1 capsule (500 mg total) by mouth 4 (four) times daily. 12/05/18   Loleta Rose, MD  clindamycin (CLEOCIN) 300 MG capsule Take 2 capsules (600 mg total) by mouth 3 (three) times daily for 10 days. 12/05/18 12/15/18  Loleta Rose, MD  cyclobenzaprine (FLEXERIL) 10 MG tablet Limit 1 tablet by mouth per day or twice a day to 4 times a day if tolerated 08/22/16   Ewing Schlein, MD  gabapentin (NEURONTIN) 300 MG capsule  02/03/18   [provider]  Oxycodone HCl 10 MG TABS Limit 4 - 8  tabs by mouth per day if tolerated ( DISCONTINUE USE OF FENTANYL PATCH AND DO NOT WEAR  FENTANYL PATCH) 08/22/16   Ewing Schlein, MD  sulfamethoxazole-trimethoprim (BACTRIM DS,SEPTRA DS) 800-160 MG tablet Take 2 tablets by mouth 2 (two) times daily. 12/05/18   Loleta Rose, MD    Allergies No known allergies  Family History  Problem  Relation Age of Onset  . Lung cancer Father   . Cancer Father     Social History Social History   Tobacco Use  . Smoking status: Current Every Day Smoker    Packs/day: 0.75    Years: 20.00    Pack years: 15.00    Types: Cigarettes  . Smokeless tobacco: Current User  Substance Use Topics  . Alcohol use: No    Alcohol/week: 0.0 standard drinks  . Drug use: No    Review of Systems Constitutional: No fever/chills Eyes: No visual changes. ENT: No sore throat. Cardiovascular: Denies chest pain. Respiratory: Denies shortness of  breath. Gastrointestinal: No abdominal pain.  No nausea, no vomiting.  No diarrhea.  No constipation. Genitourinary: Pain, redness, and swelling of the perineum as described above.  No dysuria. Musculoskeletal: Negative for neck pain.  Negative for back pain. Integumentary: Negative for rash. Neurological: Negative for headaches, focal weakness or numbness.   ____________________________________________   PHYSICAL EXAM:  VITAL SIGNS: ED Triage Vitals  Enc Vitals Group     BP 12/05/18 2137 (!) 151/83     Pulse Rate 12/05/18 2137 (!) 108     Resp 12/05/18 2137 18     Temp 12/05/18 2137 98.7 F (37.1 C)     Temp Source 12/05/18 2137 Oral     SpO2 12/05/18 2137 99 %     Weight 12/05/18 2139 88.5 kg (195 lb)     Height 12/05/18 2139 1.753 m (5\' 9" )     Head Circumference --      Peak Flow --      Pain Score 12/05/18 2139 10     Pain Loc --      Pain Edu? --      Excl. in GC? --     Constitutional: Alert and oriented.  Somewhat anxious but appropriate under the circumstances. Eyes: Conjunctivae are normal.  Head: Atraumatic. Nose: No congestion/rhinnorhea. Neck: No stridor.  No meningeal signs.   Cardiovascular: Normal rate, regular rhythm. Good peripheral circulation. Respiratory: Normal respiratory effort.  No retractions.  Gastrointestinal: Soft and nontender. No distention.  Genitourinary: The patient has a large area of erythema, swelling, and induration including the base of the scrotum, the entire perineum, and the area extending to the buttocks on both sides but mostly on the right.  There is no obviously fluctuant area and there is no crepitus.  Tender to palpation.  No obvious open or draining wound. Musculoskeletal: No lower extremity tenderness nor edema. No gross deformities of extremities. Neurologic:  Normal speech and language. No gross focal neurologic deficits are appreciated.  Skin:  Skin is warm, dry and intact. No rash noted. Psychiatric: Mood and affect  are anxious and somewhat agitated but understandable under the circumstances.  In my opinion he has the capacity to make his own medical decisions.  ____________________________________________   LABS (all labs ordered are listed, but only abnormal results are displayed)  Labs Reviewed - No data to display ____________________________________________  EKG  No indication for EKG ____________________________________________  RADIOLOGY   ED MD interpretation: Patient refused imaging  Official radiology report(s): No results found.  ____________________________________________   PROCEDURES  Critical Care performed: No   Procedure(s) performed:   Procedures   ____________________________________________   INITIAL IMPRESSION / ASSESSMENT AND PLAN / ED COURSE  As part of my medical decision making, I reviewed the following data within the electronic MEDICAL RECORD NUMBER Nursing notes reviewed and incorporated, Old chart reviewed and Notes from prior ED  visits    Differential diagnosis includes, but is not limited to, perineal cellulitis, perineal abscess, developing Fournier's gangrene.  The patient is mildly tachycardic but afebrile.  The tachycardia could be secondary to pain and agitation but also could be indicative of systemic infection.  I was able to examine the patient, but even before I entered the room as soon as I was able after starting my shift, he was already telling his nurse that he would not be able to stay and that he wanted to leave and come back in the morning because he does not have a ride and he does not have anyone to take care of his dogs.  I explained to him that I am concerned he is at risk of developing a condition called Fournier's gangrene.  I explained in layman's terms that this is commonly known as "flesh eating bacteria" and that can lead to massive loss of tissue including surgical or movement of his genitals and can even lead to death.  I  explained that in my opinion we need to do lab work including blood cultures, he needs IV antibiotics, and he needs a CT scan of the pelvis to evaluate the extent of the infection and to determine if there is anything drainable or if it is simply a soft tissue infection.  He refused all of this and as I was explaining everything to him he was putting his clothes back on.  He says that he has to go home tonight and he will come back first thing in the morning after he has a ride.  I reiterated multiple times that I am very concerned about him and as an alternative he at least accepted some oral antibiotics but he would not take IV antibiotics.  Given my concerns, I ordered Keflex 500 mg PO, clindamycin 600 mg PO, and Bactrim DS 2 tablets PO.  I also wrote him prescriptions for each 1 of these medications but I stressed to him how critically important it is that he return to an emergency department at the next available opportunity for further evaluation.  He states that he understands and although he is leaving AGAINST MEDICAL ADVICE he has the capacity to make his own decisions.     ____________________________________________  FINAL CLINICAL IMPRESSION(S) / ED DIAGNOSES  Final diagnoses:  Abscess of perineum  Cellulitis of perineum     MEDICATIONS GIVEN DURING THIS VISIT:  Medications  sulfamethoxazole-trimethoprim (BACTRIM DS,SEPTRA DS) 800-160 MG per tablet 2 tablet (2 tablets Oral Given 12/05/18 2328)  cephALEXin (KEFLEX) capsule 500 mg (500 mg Oral Given 12/05/18 2328)  clindamycin (CLEOCIN) capsule 600 mg (600 mg Oral Given 12/05/18 2328)     ED Discharge Orders         Ordered    cephALEXin (KEFLEX) 500 MG capsule  4 times daily     12/05/18 2331    sulfamethoxazole-trimethoprim (BACTRIM DS,SEPTRA DS) 800-160 MG tablet  2 times daily     12/05/18 2331    clindamycin (CLEOCIN) 300 MG capsule  3 times daily,   Status:  Discontinued     12/05/18 2331    clindamycin (CLEOCIN) 300  MG capsule  3 times daily     12/05/18 2331           Note:  This document was prepared using Dragon voice recognition software and may include unintentional dictation errors.    Loleta Rose, MD 12/05/18 2352

## 2018-12-06 ENCOUNTER — Encounter: Payer: Self-pay | Admitting: Emergency Medicine

## 2018-12-06 ENCOUNTER — Other Ambulatory Visit: Payer: Self-pay

## 2018-12-06 ENCOUNTER — Emergency Department: Payer: Medicare Other

## 2018-12-06 ENCOUNTER — Emergency Department
Admission: EM | Admit: 2018-12-06 | Discharge: 2018-12-06 | Disposition: A | Discharge status: Home / Self Care | Payer: Medicare Other | Attending: Emergency Medicine | Admitting: Emergency Medicine

## 2018-12-06 DIAGNOSIS — N189 Chronic kidney disease, unspecified: Secondary | ICD-10-CM | POA: Diagnosis not present

## 2018-12-06 DIAGNOSIS — L03315 Cellulitis of perineum: Secondary | ICD-10-CM

## 2018-12-06 DIAGNOSIS — Z79899 Other long term (current) drug therapy: Secondary | ICD-10-CM | POA: Diagnosis not present

## 2018-12-06 DIAGNOSIS — R600 Localized edema: Secondary | ICD-10-CM | POA: Diagnosis present

## 2018-12-06 DIAGNOSIS — F17210 Nicotine dependence, cigarettes, uncomplicated: Secondary | ICD-10-CM | POA: Diagnosis not present

## 2018-12-06 LAB — CBC WITH DIFFERENTIAL/PLATELET
Abs Immature Granulocytes: 0.12 10*3/uL — ABNORMAL HIGH (ref 0.00–0.07)
Basophils Absolute: 0.1 10*3/uL (ref 0.0–0.1)
Basophils Relative: 0 %
Eosinophils Absolute: 0.2 10*3/uL (ref 0.0–0.5)
Eosinophils Relative: 1 %
HCT: 40.6 % (ref 39.0–52.0)
Hemoglobin: 13.8 g/dL (ref 13.0–17.0)
Immature Granulocytes: 1 %
Lymphocytes Relative: 20 %
Lymphs Abs: 2.8 10*3/uL (ref 0.7–4.0)
MCH: 31.2 pg (ref 26.0–34.0)
MCHC: 34 g/dL (ref 30.0–36.0)
MCV: 91.9 fL (ref 80.0–100.0)
Monocytes Absolute: 0.9 10*3/uL (ref 0.1–1.0)
Monocytes Relative: 7 %
Neutro Abs: 10.2 10*3/uL — ABNORMAL HIGH (ref 1.7–7.7)
Neutrophils Relative %: 71 %
Platelets: 252 10*3/uL (ref 150–400)
RBC: 4.42 MIL/uL (ref 4.22–5.81)
RDW: 12.2 % (ref 11.5–15.5)
WBC: 14.2 10*3/uL — ABNORMAL HIGH (ref 4.0–10.5)
nRBC: 0 % (ref 0.0–0.2)

## 2018-12-06 LAB — COMPREHENSIVE METABOLIC PANEL
ALK PHOS: 79 U/L (ref 38–126)
ALT: 24 U/L (ref 0–44)
AST: 26 U/L (ref 15–41)
Albumin: 4.2 g/dL (ref 3.5–5.0)
Anion gap: 9 (ref 5–15)
BUN: 8 mg/dL (ref 6–20)
CALCIUM: 8.8 mg/dL — AB (ref 8.9–10.3)
CO2: 26 mmol/L (ref 22–32)
Chloride: 101 mmol/L (ref 98–111)
Creatinine, Ser: 0.82 mg/dL (ref 0.61–1.24)
GFR calc Af Amer: >60 mL/min (ref >60)
GFR calc non Af Amer: >60 mL/min (ref >60)
Glucose, Bld: 90 mg/dL (ref 70–99)
Potassium: 3.3 mmol/L — ABNORMAL LOW (ref 3.5–5.1)
Sodium: 136 mmol/L (ref 135–145)
Total Bilirubin: 0.4 mg/dL (ref 0.3–1.2)
Total Protein: 7.6 g/dL (ref 6.5–8.1)

## 2018-12-06 LAB — URINALYSIS, COMPLETE (UACMP) WITH MICROSCOPIC
BACTERIA UA: NONE SEEN
BILIRUBIN URINE: NEGATIVE
Glucose, UA: NEGATIVE mg/dL
Ketones, ur: NEGATIVE mg/dL
Leukocytes, UA: NEGATIVE
NITRITE: NEGATIVE
Protein, ur: NEGATIVE mg/dL
Specific Gravity, Urine: 1.005 (ref 1.005–1.030)
Squamous Epithelial / HPF: NONE SEEN (ref 0–5)
pH: 7 (ref 5.0–8.0)

## 2018-12-06 LAB — LACTIC ACID, PLASMA: Lactic Acid, Venous: 1 mmol/L (ref 0.5–1.9)

## 2018-12-06 MED ORDER — IOPAMIDOL (ISOVUE-300) INJECTION 61%
100.0000 mL | Freq: Once | INTRAVENOUS | Status: AC | PRN
  Administered 2018-12-06: 100 mL via INTRAVENOUS
  Filled 2018-12-06: qty 100

## 2018-12-06 MED ORDER — MORPHINE SULFATE (PF) 4 MG/ML IV SOLN
4.0000 mg | Freq: Once | INTRAVENOUS | Status: AC
  Administered 2018-12-06: 4 mg via INTRAVENOUS
  Filled 2018-12-06: qty 1

## 2018-12-06 MED ORDER — ONDANSETRON HCL 4 MG/2ML IJ SOLN
4.0000 mg | Freq: Once | INTRAMUSCULAR | Status: AC
  Administered 2018-12-06: 4 mg via INTRAVENOUS
  Filled 2018-12-06: qty 2

## 2018-12-06 MED ORDER — SODIUM CHLORIDE 0.9 % IV SOLN
3.0000 g | Freq: Once | INTRAVENOUS | Status: AC
  Administered 2018-12-06: 3 g via INTRAVENOUS
  Filled 2018-12-06: qty 3

## 2018-12-06 MED ORDER — METRONIDAZOLE IN NACL 5-0.79 MG/ML-% IV SOLN
500.0000 mg | Freq: Once | INTRAVENOUS | Status: AC
  Administered 2018-12-06: 500 mg via INTRAVENOUS
  Filled 2018-12-06: qty 100

## 2018-12-06 MED ORDER — CLINDAMYCIN PHOSPHATE 600 MG/50ML IV SOLN
600.0000 mg | Freq: Once | INTRAVENOUS | Status: AC
  Administered 2018-12-06: 600 mg via INTRAVENOUS
  Filled 2018-12-06: qty 50

## 2018-12-06 MED ORDER — SODIUM CHLORIDE 0.9 % IV BOLUS
1000.0000 mL | Freq: Once | INTRAVENOUS | Status: AC
  Administered 2018-12-06: 1000 mL via INTRAVENOUS

## 2018-12-06 NOTE — ED Notes (Signed)
Pt returns to be treated for ongoing wound/cellulitis to perineum. Pt c/o pain, redness and swelling to perineum x 3 days, worsening over time, spreading along gluteal crease.

## 2018-12-06 NOTE — ED Provider Notes (Signed)
[facility] Emergency Department Provider Note  ____________________________________________  Time seen: Approximately 7:28 PM  I have reviewed the triage vital signs and the nursing notes.   HISTORY  Chief Complaint Abscess    HPI Mark Snow is a 47 y.o. male who presents the emergency department after leaving AMA last night for complaint of groin pain/cellulitis.  Patient presented to the emergency department last night but left AMA prior to full assessment.  Patient has had erythema, edema, pain to the perineal region for 3 to 4 days.  Patient reports that he had a "pimple" to the area that he squeezed and expressed some mild pus.  No ongoing drainage.  Patient denies any dysuria, polyuria, hematuria.  He denies any penile drainage.  Patient does report some scrotal pain and significant perineal pain.  On assessment last night, it was recommended that patient have IV antibiotics, IV fluids, CT of the pelvis.  Patient was unable to wait and left after being prescribed multiple antibiotics.  Patient has not filled these antibiotics and returns to the emergency department as symptoms have not improved.  Patient denies any fevers or chills, abdominal pain, nausea or vomiting, diarrhea or constipation.  Other than Tylenol Motrin, patient denies any medications for his complaint.  No history of recurrent skin lesions.   Patient has a history of anxiety, chronic kidney disease, genital warts, hyperlipidemia, MRSA, degenerative disc disease.  Patient denies any alcohol or drug use.  He does smoke.  He denies any diabetes, history of HIV, history of hepatitis.  No recent instrumentation in the groin.  No recent catheterization.  No history of prostate disorders.   Past Medical History:  Diagnosis Date  . Anxiety   . Arthritis   . Back pain   . Back pain, chronic   . Chronic kidney disease   . Depression   . Genital warts   . HLD (hyperlipidemia)   . Insomnia    . MRSA (methicillin resistant staph aureus) culture positive   . Neck pain, chronic     Patient Active Problem List   Diagnosis Date Noted  . GAD (generalized anxiety disorder) 11/27/2016  . Insomnia 10/29/2016  . Genital warts 10/29/2016  . Major depression, recurrent (HCC) 03/19/2016  . Borderline diabetes mellitus 03/19/2016  . Chronic pain 03/19/2016  . HLD (hyperlipidemia) 03/19/2016  . Current tobacco use 03/19/2016  . DDD (degenerative disc disease), lumbar 05/12/2015  . DDD (degenerative disc disease), cervical 05/12/2015  . Cervical facet syndrome 05/12/2015  . Lumbosacral facet joint syndrome 05/12/2015  . Spinal stenosis, lumbar region, with neurogenic claudication 05/12/2015    Past Surgical History:  Procedure Laterality Date  . BACK SURGERY  2009,2013   Dr. Yetta BarreJones  . INCISION AND DRAINAGE ABSCESS  03/22/14   buttock  . NECK SURGERY  2007,2010    Prior to Admission medications   Medication Sig Start Date End Date Taking? Authorizing Provider  cephALEXin (KEFLEX) 500 MG capsule Take 1 capsule (500 mg total) by mouth 4 (four) times daily. 12/05/18   Loleta RoseForbach, Cory, MD  clindamycin (CLEOCIN) 300 MG capsule Take 2 capsules (600 mg total) by mouth 3 (three) times daily for 10 days. 12/05/18 12/15/18  Loleta RoseForbach, Cory, MD  cyclobenzaprine (FLEXERIL) 10 MG tablet Limit 1 tablet by mouth per day or twice a day to 4 times a day if tolerated 08/22/16   Ewing Schleinrisp, Gregory, MD  gabapentin (NEURONTIN) 300 MG capsule  02/03/18   [provider]  Oxycodone HCl 10  MG TABS Limit 4 - 8  tabs by mouth per day if tolerated ( DISCONTINUE USE OF FENTANYL PATCH AND DO NOT WEAR  FENTANYL PATCH) 08/22/16   Ewing Schlein, MD  sulfamethoxazole-trimethoprim (BACTRIM DS,SEPTRA DS) 800-160 MG tablet Take 2 tablets by mouth 2 (two) times daily. 12/05/18   Loleta Rose, MD    Allergies No known allergies  Family History  Problem Relation Age of Onset  . Lung cancer Father   . Cancer Father      Social History Social History   Tobacco Use  . Smoking status: Current Every Day Smoker    Packs/day: 0.75    Years: 20.00    Pack years: 15.00    Types: Cigarettes  . Smokeless tobacco: Current User  Substance Use Topics  . Alcohol use: No    Alcohol/week: 0.0 standard drinks  . Drug use: No     Review of Systems  Constitutional: No fever/chills Eyes: No visual changes. No discharge ENT: No upper respiratory complaints. Cardiovascular: no chest pain. Respiratory: no cough. No SOB. Gastrointestinal: No abdominal pain.  No nausea, no vomiting.  No diarrhea.  No constipation. Genitourinary: Negative for dysuria. No hematuria.  Musculoskeletal: Negative for musculoskeletal pain. Skin: Negative for rash, abrasions, lacerations, ecchymosis.  Positive for erythematous, edematous lesion in the perineal region Neurological: Negative for headaches, focal weakness or numbness. 10-point ROS otherwise negative.  ____________________________________________   PHYSICAL EXAM:  VITAL SIGNS: ED Triage Vitals [12/06/18 1749]  Enc Vitals Group     BP (!) 142/88     Pulse Rate (!) 107     Resp 18     Temp 97.7 F (36.5 C)     Temp Source Oral     SpO2 100 %     Weight 195 lb (88.5 kg)     Height 5\' 9"  (1.753 m)     Head Circumference      Peak Flow      Pain Score 10     Pain Loc      Pain Edu?      Excl. in GC?      Constitutional: Alert and oriented. Well appearing and in no acute distress. Eyes: Conjunctivae are normal. PERRL. EOMI. Head: Atraumatic. Neck: No stridor.   Hematological/Lymphatic/Immunilogical: No cervical lymphadenopathy.  No inguinal lymphadenopathy. Cardiovascular: Normal rate, regular rhythm. Normal S1 and S2.  Good peripheral circulation. Respiratory: Normal respiratory effort without tachypnea or retractions. Lungs CTAB. Good air entry to the bases with no decreased or absent breath sounds. Gastrointestinal: Bowel sounds 4 quadrants. Soft and  nontender to palpation. No guarding or rigidity. No palpable masses. No distention. No CVA tenderness. Genitourinary: Visualization of the peritoneal space reveals significant erythema, edema.  No appreciable open lesions.  No drainage appreciated.  On palpation, firmness is palpated but no fluctuance.  No induration.  Patient is exquisitely tender to palpation in the perineal region extending into the right inguinal region.  Patient is non-tender to palpation over scrotum or bilateral testicles.  No palpable abnormality.  No genital lesions identified.  No penile drainage. Musculoskeletal: Full range of motion to all extremities. No gross deformities appreciated. Neurologic:  Normal speech and language. No gross focal neurologic deficits are appreciated.  Skin:  Skin is warm, dry and intact. No rash noted. Psychiatric: Mood and affect are normal. Speech and behavior are normal. Patient exhibits appropriate insight and judgement.   ____________________________________________   LABS (all labs ordered are listed, but only abnormal results are displayed)  Labs Reviewed  COMPREHENSIVE METABOLIC PANEL - Abnormal; Notable for the following components:      Result Value   Potassium 3.3 (*)    Calcium 8.8 (*)    All other components within normal limits  CBC WITH DIFFERENTIAL/PLATELET - Abnormal; Notable for the following components:   WBC 14.2 (*)    Neutro Abs 10.2 (*)    Abs Immature Granulocytes 0.12 (*)    All other components within normal limits  URINALYSIS, COMPLETE (UACMP) WITH MICROSCOPIC - Abnormal; Notable for the following components:   Color, Urine STRAW (*)    APPearance CLEAR (*)    Hgb urine dipstick MODERATE (*)    All other components within normal limits  CULTURE, BLOOD (ROUTINE X 2)  CULTURE, BLOOD (ROUTINE X 2)  LACTIC ACID, PLASMA   ____________________________________________  EKG   ____________________________________________  RADIOLOGY I personally viewed  and evaluated these images as part of my medical decision making, as well as reviewing the written report by the radiologist.  Ct Pelvis W Contrast  Result Date: 12/06/2018 CLINICAL DATA:  Wound/cellulitis along the perineum. EXAM: CT PELVIS WITH CONTRAST TECHNIQUE: Multidetector CT imaging of the pelvis was performed using the standard protocol following the bolus administration of intravenous contrast. CONTRAST:  ISOVUE-300 IOPAMIDOL (ISOVUE-300) INJECTION 61% COMPARISON:  None. FINDINGS: Urinary Tract:  Distal urinary tract unremarkable. Bowel:  Unremarkable Vascular/Lymphatic: Right external iliac node 1.3 cm in short axis on image 70/3. Clustered smaller left external iliac and inguinal lymph nodes are observed. Left common iliac node 0.9 cm in short axis on image 8/3. Reproductive:  The prostate gland appears unremarkable. Cutaneous thickening and mild edema in the scrotal wall extending up into the perineum, greater on the left than the right. No gas tracking in the soft tissues. No appreciable abscess. No appreciable muscular involvement. Other:  No supplemental non-categorized findings. Musculoskeletal: Unremarkable IMPRESSION: 1. Edema of the scrotal wall extending up into the perineum, left greater than right, tracking slightly along the left inferior buttock region. No gas in the soft tissues or abscess. The appearance is suspicious for cellulitis. Appearance is more mild than is typically encountered in the setting of Fournier's gangrene, but treatment and careful surveillance may be warranted to exclude fulminant progression. Electronically Signed   By: Gaylyn Rong M.D.   On: 12/06/2018 21:20    ____________________________________________    PROCEDURES  Procedure(s) performed:    Procedures    Medications  sodium chloride 0.9 % bolus 1,000 mL (0 mLs Intravenous Stopped 12/06/18 2038)  Ampicillin-Sulbactam (UNASYN) 3 g in sodium chloride 0.9 % 100 mL IVPB (0 g  Intravenous Stopped 12/06/18 2029)  clindamycin (CLEOCIN) IVPB 600 mg (0 mg Intravenous Stopped 12/06/18 2122)  metroNIDAZOLE (FLAGYL) IVPB 500 mg (0 mg Intravenous Stopped 12/06/18 2038)  morphine 4 MG/ML injection 4 mg (4 mg Intravenous Given 12/06/18 1939)  ondansetron (ZOFRAN) injection 4 mg (4 mg Intravenous Given 12/06/18 1939)  iopamidol (ISOVUE-300) 61 % injection 100 mL (100 mLs Intravenous Contrast Given 12/06/18 2058)  morphine 4 MG/ML injection 4 mg (4 mg Intravenous Given 12/06/18 2128)     ____________________________________________   INITIAL IMPRESSION / ASSESSMENT AND PLAN / ED COURSE  Pertinent labs & imaging results that were available during my care of the patient were reviewed by me and considered in my medical decision making (see chart for details).  Review of the [place] CSRS was performed in accordance of the [facility] prior to dispensing any controlled drugs.  Clinical Course as of  Dec 06 2152  Sat Dec 06, 2018  1926 Patient returns the emergency department after leaving AMA last night.  Patient presents back for continued perineal pain, erythema, edema.  Patient reports that he had a "lesion" that he was able to squeeze some pus from for the last 2 days.  Last night, patient presented but was unwilling to wait for labs, imaging.  Patient had been placed on 3 separate antibiotics orally, patient did not fill any of his antibiotics and has not taken any dosing of antibiotics with the exception of oral antibiotics last night prior to leaving.  On exam, patient does have significant erythema, edema consistent with infection in the perineal region.  Differential includes folliculitis, cellulitis, abscess, Fournier's gangrene, intra-abdominal pathology with fistula.  Given patient's symptoms, physical exam findings, he will be evaluated with labs, CT scan of the pelvis.  Currently, patient is afebrile without antipyretics, however he is slightly tachycardic.  Patient will also be  given fluids.  Given differential of Fournier's gangrene, patient will be started on Unasyn, clindamycin, Flagyl here in the emergency department pending further results.   [JC]  2142 Patient's labs returned with overall reassuring results.  Patient does have mild leukocytosis.  Lactic acid however was normal.  At this time, CT reveals no soft tissue gas concerning for Fournier gangrene.  At this time, no indication of sepsis.  No indication of abscess.  Patient will be treated outpatient with antibiotics that he is already been prescribed to include Keflex, Bactrim and clindamycin.  Patient will have either close follow-up with primary care or return to the emergency department for any worsening of symptoms.  No further work-up at this time.  No indication for admission.   [JC]    Clinical Course User Index [JC] Fermin Yan, Delorise Royals, PA-C     Patient's diagnosis is consistent with perineal cellulitis.  Patient presents to the emergency department after leaving AMA last night.  Patient had been prescribed 3 antibiotics, Keflex, Bactrim, clindamycin which she did not feel.  Patient had ongoing cellulitic changes.  Given the location, significant pain, concern was that patient may have Fournier gangrene.  Patient did have mild leukocytosis but lactic acid was reassuring.  No indication of the fluid collection concerning for abscess on CT.  No indication of soft tissue gas on CT concerning for fournier gangrene.  As such, patient has received IV antibiotics in the emergency department and will be discharged to take already prescribed antibiotic regimen of keflex, bactrim, clindamycin.  Patient will either follow-up with primary care or this department if symptoms worsen.  No further work-up.  No indication for admission.  Patient is given concerning symptoms to be aware of. He verbalizes understanding of same.  Patient receives chronic pain medication and as such no pain medications will be prescribed..   Patient is given ED precautions to return to the ED for any worsening or new symptoms.     ____________________________________________  FINAL CLINICAL IMPRESSION(S) / ED DIAGNOSES  Final diagnoses:  Cellulitis of perineum      NEW MEDICATIONS STARTED DURING THIS VISIT:  ED Discharge Orders    None          This chart was dictated using voice recognition software/Dragon. Despite best efforts to proofread, errors can occur which can change the meaning. Any change was purely unintentional.    Racheal Patches, PA-C 12/06/18 2153    Jene Every, MD 12/06/18 2157

## 2018-12-06 NOTE — ED Triage Notes (Signed)
Stated abscess L scrotum x 3 days.

## 2018-12-06 NOTE — ED Notes (Addendum)
Groin abscess x 3 days. Pain 10/10

## 2018-12-11 LAB — CULTURE, BLOOD (ROUTINE X 2)
Culture: NO GROWTH
Culture: NO GROWTH
SPECIAL REQUESTS: ADEQUATE
Special Requests: ADEQUATE

## 2019-02-17 ENCOUNTER — Ambulatory Visit: Payer: Medicare Other

## 2019-06-02 ENCOUNTER — Telehealth: Payer: Self-pay | Admitting: Family Medicine

## 2019-06-02 NOTE — Telephone Encounter (Signed)
resched AWV

## 2019-06-13 ENCOUNTER — Emergency Department
Admission: EM | Admit: 2019-06-13 | Discharge: 2019-06-13 | Disposition: A | Discharge status: Home / Self Care | Payer: Medicare Other | Attending: Emergency Medicine | Admitting: Emergency Medicine

## 2019-06-13 ENCOUNTER — Other Ambulatory Visit: Payer: Self-pay

## 2019-06-13 DIAGNOSIS — Y999 Unspecified external cause status: Secondary | ICD-10-CM | POA: Insufficient documentation

## 2019-06-13 DIAGNOSIS — S6981XA Other specified injuries of right wrist, hand and finger(s), initial encounter: Secondary | ICD-10-CM | POA: Diagnosis present

## 2019-06-13 DIAGNOSIS — Z5321 Procedure and treatment not carried out due to patient leaving prior to being seen by health care provider: Secondary | ICD-10-CM | POA: Insufficient documentation

## 2019-06-13 DIAGNOSIS — Y9389 Activity, other specified: Secondary | ICD-10-CM | POA: Insufficient documentation

## 2019-06-13 DIAGNOSIS — Y92007 Garden or yard of unspecified non-institutional (private) residence as the place of occurrence of the external cause: Secondary | ICD-10-CM | POA: Insufficient documentation

## 2019-06-13 DIAGNOSIS — W5501XA Bitten by cat, initial encounter: Secondary | ICD-10-CM | POA: Diagnosis not present

## 2019-06-13 DIAGNOSIS — R21 Rash and other nonspecific skin eruption: Secondary | ICD-10-CM | POA: Insufficient documentation

## 2019-06-13 NOTE — ED Notes (Signed)
Off duty Chemical engineer notified and will notify animal control

## 2019-06-13 NOTE — ED Triage Notes (Signed)
Patient reports he had let his dogs out in the yard and there was a cat in his yard and his dogs went after it.  Patient reports he was trying to get it away from his dogs and the cat bite him on the right hand.  Patient also reports while trying to get the cat from the dogs he hurt his back.  Patient also reports his now has a rash all over.

## 2019-06-18 ENCOUNTER — Other Ambulatory Visit: Payer: Self-pay

## 2019-06-18 ENCOUNTER — Ambulatory Visit: Payer: Medicare Other | Admitting: Family Medicine

## 2019-06-19 ENCOUNTER — Ambulatory Visit (INDEPENDENT_AMBULATORY_CARE_PROVIDER_SITE_OTHER): Payer: Medicare Other | Admitting: Family Medicine

## 2019-06-19 ENCOUNTER — Other Ambulatory Visit: Payer: Self-pay

## 2019-06-19 ENCOUNTER — Encounter: Payer: Self-pay | Admitting: Family Medicine

## 2019-06-19 VITALS — BP 124/74 | HR 102 | Temp 98.2°F | Ht 69.0 in | Wt 180.8 lb

## 2019-06-19 DIAGNOSIS — G894 Chronic pain syndrome: Secondary | ICD-10-CM | POA: Diagnosis not present

## 2019-06-19 DIAGNOSIS — M5136 Other intervertebral disc degeneration, lumbar region: Secondary | ICD-10-CM

## 2019-06-19 DIAGNOSIS — M47817 Spondylosis without myelopathy or radiculopathy, lumbosacral region: Secondary | ICD-10-CM

## 2019-06-19 DIAGNOSIS — M5030 Other cervical disc degeneration, unspecified cervical region: Secondary | ICD-10-CM | POA: Diagnosis not present

## 2019-06-19 DIAGNOSIS — F3341 Major depressive disorder, recurrent, in partial remission: Secondary | ICD-10-CM

## 2019-06-19 DIAGNOSIS — M48062 Spinal stenosis, lumbar region with neurogenic claudication: Secondary | ICD-10-CM

## 2019-06-19 DIAGNOSIS — M47812 Spondylosis without myelopathy or radiculopathy, cervical region: Secondary | ICD-10-CM

## 2019-06-19 MED ORDER — DULOXETINE HCL 30 MG PO CPEP: 30.0000 mg | ORAL_CAPSULE | Freq: Every day | ORAL | 0 refills | Status: DC

## 2019-06-19 MED ORDER — PREDNISONE 20 MG PO TABS: ORAL_TABLET | ORAL | 0 refills | Status: DC

## 2019-06-19 NOTE — Patient Instructions (Addendum)
Thank you for coming to the office today.  Referral to [facility]  Stay tuned.  Ordered prednisone steroid burst for 7 day taper, if you need a repeat within 1 month we can.  Ordered Duloxetine (cymbalta) 30mg  daily - only 30 pills for 1 month, let me know by 2-4 weeks if you need to increase up to 60mg , can send new rx.  When ready to do physical - if feeling better will need labs  DUE for FASTING BLOOD WORK (no food or drink after midnight before the lab appointment, only water or coffee without cream/sugar on the morning of)  SCHEDULE "Lab Only" visit in the morning at the clinic for lab draw in 3 MONTHS    - Make sure Lab Only appointment is at about 1 week before your next appointment, so that results will be available  For Lab Results, once available within 2-3 days of blood draw, you can can log in to MyChart online to view your results and a brief explanation. Also, we can discuss results at next follow-up visit.    Please schedule a Follow-up Appointment to: Return in about 3 months (around 09/19/2019), or if symptoms worsen or fail to improve, for Follow-up 6 weeks to 3 months - either pain / or physical.  If you have any other questions or concerns, please feel free to call the office or send a message through [place]. You may also schedule an earlier appointment if necessary.  Additionally, you may be receiving a survey about your experience at our office within a few days to 1 week by e-mail or mail. We value your feedback.  [name], DO [place]
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
## 2019-07-08 ENCOUNTER — Other Ambulatory Visit: Payer: Self-pay | Admitting: Family Medicine

## 2019-07-08 DIAGNOSIS — M48062 Spinal stenosis, lumbar region with neurogenic claudication: Secondary | ICD-10-CM

## 2019-07-08 DIAGNOSIS — G4701 Insomnia due to medical condition: Secondary | ICD-10-CM

## 2019-07-08 DIAGNOSIS — G894 Chronic pain syndrome: Secondary | ICD-10-CM

## 2019-07-08 MED ORDER — DULOXETINE HCL 60 MG PO CPEP: 60.0000 mg | ORAL_CAPSULE | Freq: Every day | ORAL | 1 refills | Status: AC

## 2019-07-08 NOTE — Telephone Encounter (Signed)
Pt called requesting refill on  Cymbalta 30 mg,  Pt also is requesting something to  help him sleep

## 2019-07-08 NOTE — Telephone Encounter (Signed)
The pt state he already increased his Cymbalta from 30 to 60MG . He currently takes 20 MG of Melatonin nightly and still having trouble with falling asleep.

## 2019-07-08 NOTE — Telephone Encounter (Signed)
He was asked to notify us if he needed dose increase on Cymbalta from 30 to 60.  Can you clarify if he wants to try the increase dose at 60mg ? This could be better for pain and also help him sleep.  We did not discuss insomnia at last visit. We would need to discuss this next visit or with a new virtual apt at some point.  Otherwise he can try OTC Melatonin 5mg  nightly to start and increase up to 10mg  in future.  Let me know if I should re order Cymbalta 30mg  or order higher dose 60mg .  [name], [facility] 07/08/2019, 12:01 PM

## 2019-07-08 NOTE — Telephone Encounter (Signed)
Alright. I will go ahead and sent the cymbalta 60mg  then.  20mg  of melatonin has not been proven to be more effective than 10mg .  It sounds like he needs to do a virtual visit consultation on other sleep rx medication, he may schedule whenever to discuss that.  [name], [facility] 07/08/2019, 1:17 PM

## 2019-07-08 NOTE — Telephone Encounter (Signed)
The pt was notified and appt scheduled. 

## 2019-07-10 ENCOUNTER — Ambulatory Visit (INDEPENDENT_AMBULATORY_CARE_PROVIDER_SITE_OTHER): Payer: Medicare Other | Admitting: Family Medicine

## 2019-07-10 ENCOUNTER — Encounter: Payer: Self-pay | Admitting: Family Medicine

## 2019-07-10 ENCOUNTER — Other Ambulatory Visit: Payer: Self-pay

## 2019-07-10 DIAGNOSIS — M47817 Spondylosis without myelopathy or radiculopathy, lumbosacral region: Secondary | ICD-10-CM

## 2019-07-10 DIAGNOSIS — M47812 Spondylosis without myelopathy or radiculopathy, cervical region: Secondary | ICD-10-CM

## 2019-07-10 DIAGNOSIS — F3341 Major depressive disorder, recurrent, in partial remission: Secondary | ICD-10-CM | POA: Diagnosis not present

## 2019-07-10 DIAGNOSIS — G4701 Insomnia due to medical condition: Secondary | ICD-10-CM

## 2019-07-10 DIAGNOSIS — M48062 Spinal stenosis, lumbar region with neurogenic claudication: Secondary | ICD-10-CM

## 2019-07-10 MED ORDER — ZOLPIDEM TARTRATE 5 MG PO TABS: 5.0000 mg | ORAL_TABLET | Freq: Every evening | ORAL | 1 refills | Status: AC | PRN

## 2019-07-10 MED ORDER — MIRTAZAPINE 15 MG PO TBDP: 15.0000 mg | ORAL_TABLET | Freq: Every day | ORAL | 2 refills | Status: AC

## 2019-07-10 NOTE — Patient Instructions (Signed)
AVS info given to patient by phone. 
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
## 2019-09-24 DEATH — deceased

## 2019-10-07 IMAGING — CT CT PELVIS W/ CM
2 of 3 series · 17 of 46 positions shown, 19 images · IV contrast (iopamidol)
Comparison: None.

CLINICAL DATA: Wound/cellulitis along the perineum.

EXAM:
CT PELVIS WITH CONTRAST
TECHNIQUE: Multidetector CT imaging of the pelvis was performed using the
standard protocol following the bolus administration of intravenous
contrast.
CONTRAST:  100mL PIP5CH-O00 IOPAMIDOL (PIP5CH-O00) INJECTION 61%

[Series 3: axial st · axial · 0.96mm/px · z∈[-1269,-971]mm · 14 of 173 slices shown, 16 images]
[im 12/173  soft-tissue]
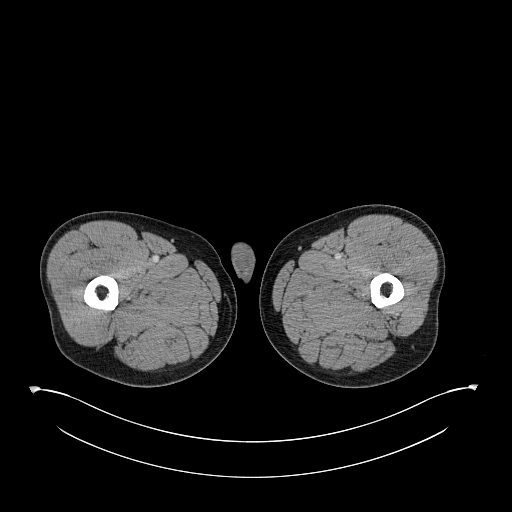
[im 12/173  bone]
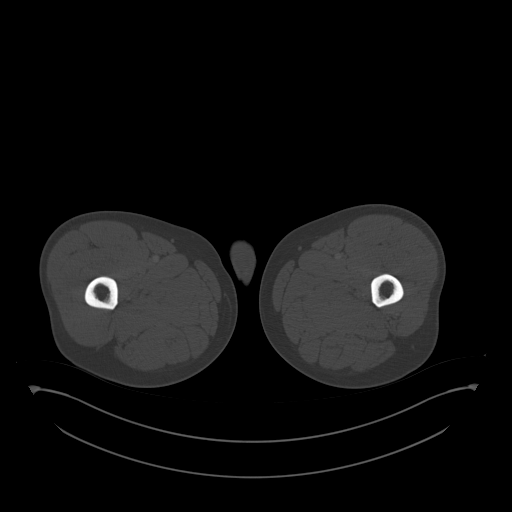
[im 23/173  soft-tissue]
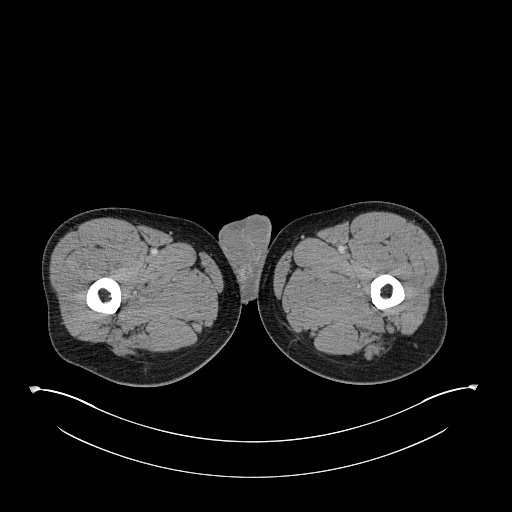
[im 34/173  soft-tissue]
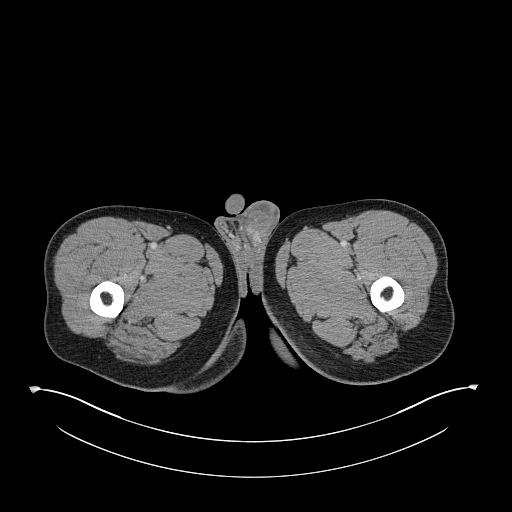
[im 45/173  soft-tissue]
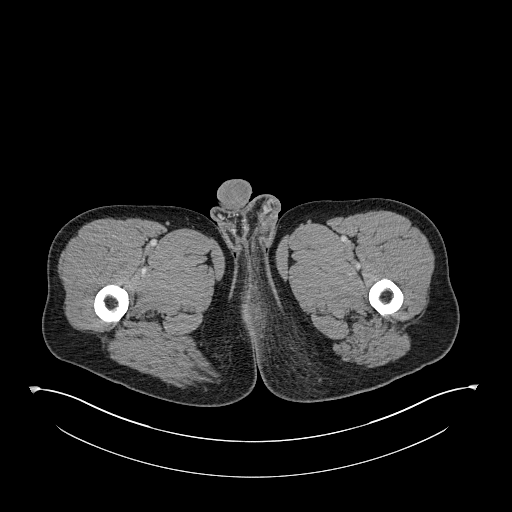
[im 56/173  soft-tissue]
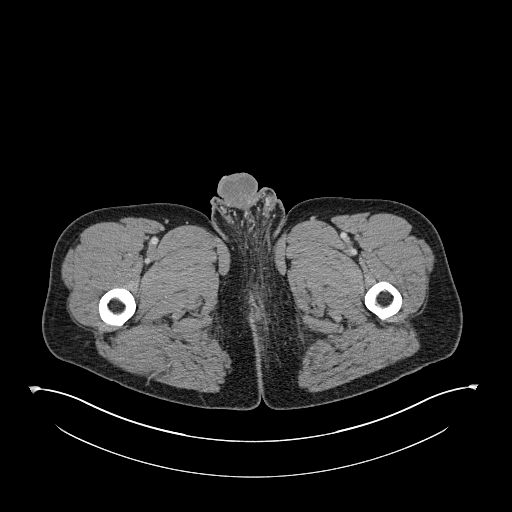
[im 67/173  soft-tissue]
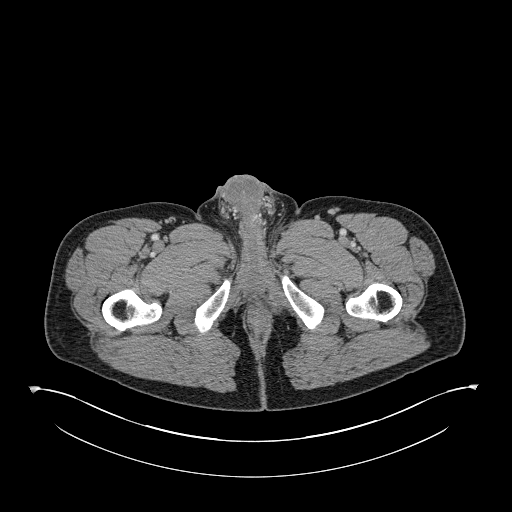
[im 78/173  soft-tissue]
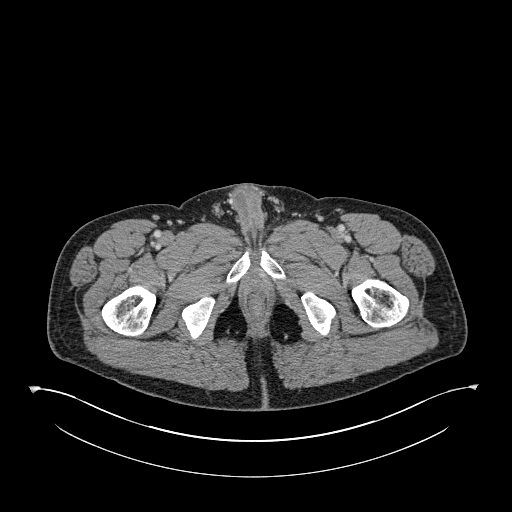
[im 95/173  soft-tissue]
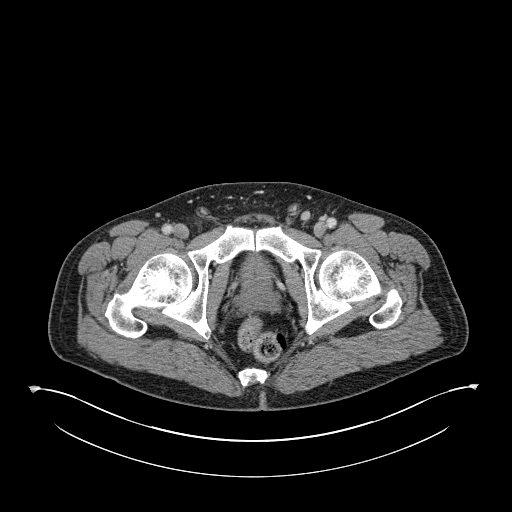
[im 106/173  soft-tissue]
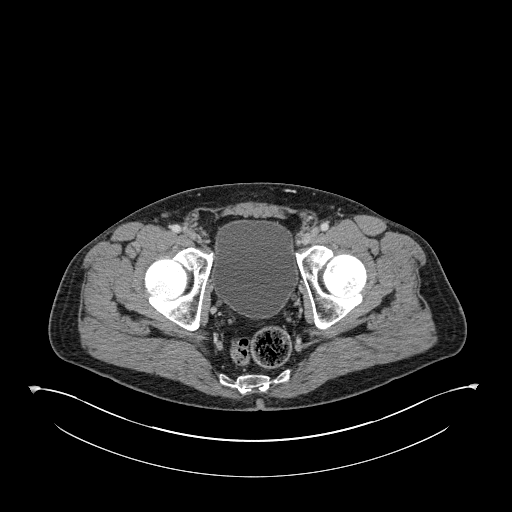
[im 106/173  bone]
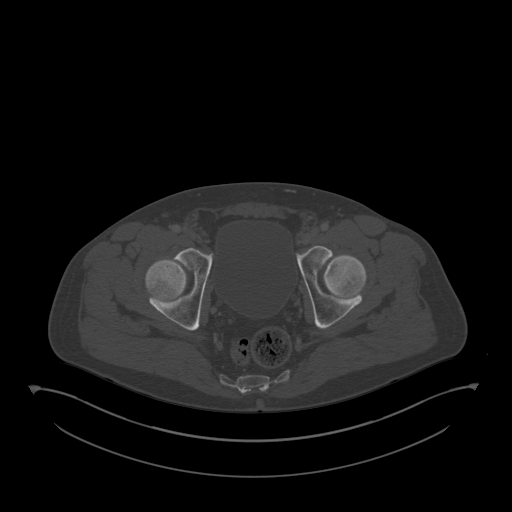
[im 117/173  soft-tissue]
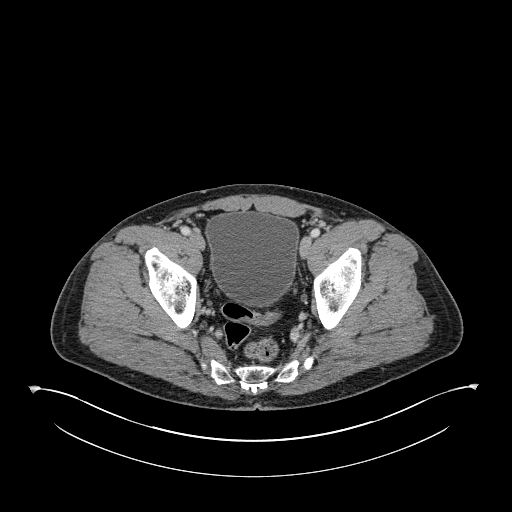
[im 128/173  soft-tissue]
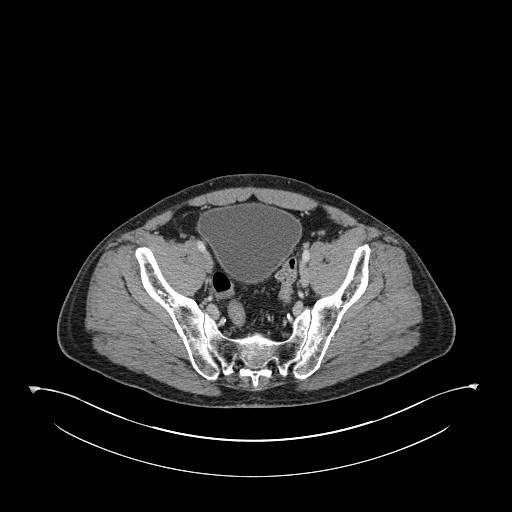
[im 139/173  soft-tissue]
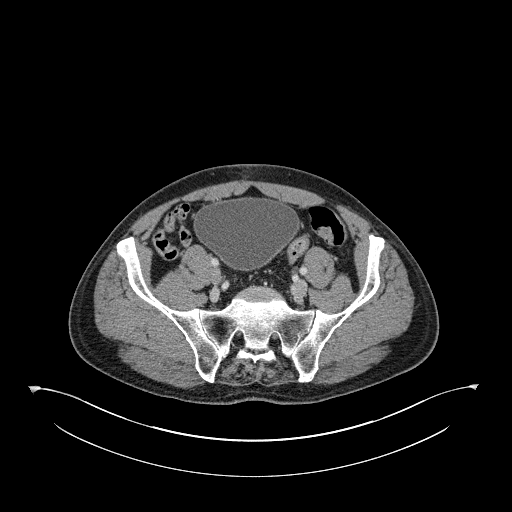
[im 150/173  soft-tissue]
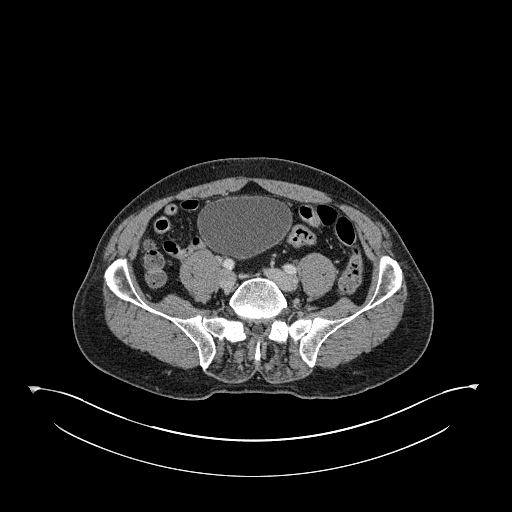
[im 161/173  soft-tissue]
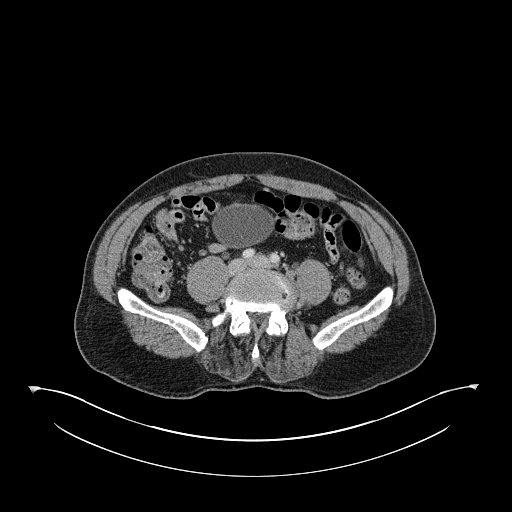

[Series 6: coronal st · coronal · 0.71mm/px · 3 of 127 slices shown]
[im 43/127  soft-tissue]
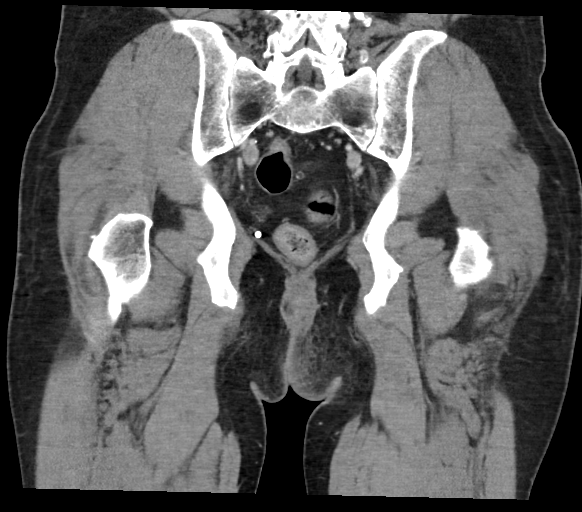
[im 57/127  soft-tissue]
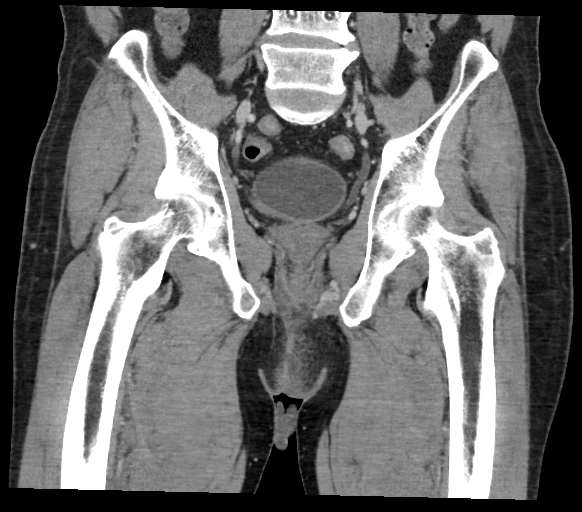
[im 71/127  soft-tissue]
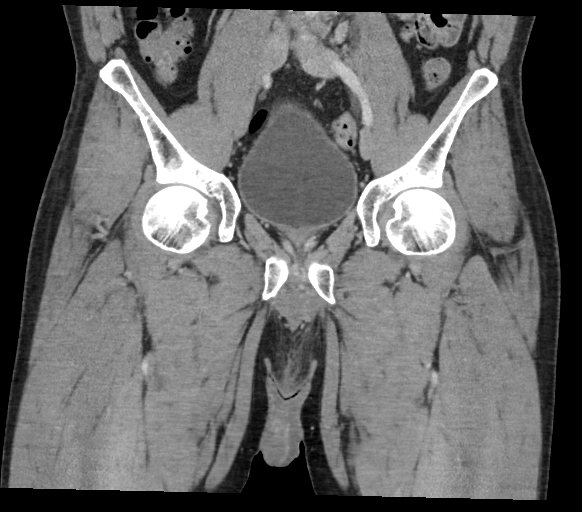

[17 of 46 positions shown; findings below may reference images not displayed]

FINDINGS: Urinary Tract:  Distal urinary tract unremarkable.

Bowel:  Unremarkable

Vascular/Lymphatic: Right external iliac node 1.3 cm in short axis
on image 70/3. Clustered smaller left external iliac and inguinal
lymph nodes are observed. Left common iliac node 0.9 cm in short
axis on image [DATE].

Reproductive:  The prostate gland appears unremarkable.

Cutaneous thickening and mild edema in the scrotal wall extending up
into the perineum, greater on the left than the right. No gas
tracking in the soft tissues. No appreciable abscess. No appreciable
muscular involvement.

Other:  No supplemental non-categorized findings.

Musculoskeletal: Unremarkable
IMPRESSION: 1. Edema of the scrotal wall extending up into the perineum, left
greater than right, tracking slightly along the left inferior
buttock region. No gas in the soft tissues or abscess. The
appearance is suspicious for cellulitis. Appearance is more mild
than is typically encountered in the setting of Verdini gangrene,
but treatment and careful surveillance may be warranted to exclude
fulminant progression.

## 2019-12-23 ENCOUNTER — Telehealth: Payer: Self-pay | Admitting: Family Medicine

## 2019-12-23 NOTE — Telephone Encounter (Signed)
I called the patient to schedule AWV but was told that it's the wrong number.
